# Patient Record
Sex: Female | Born: 1970
Health system: Southern US, Community
[De-identification: ages and names within clinical notes are randomized; demographics above are authoritative.]

## PROBLEM LIST (undated history)

## (undated) DIAGNOSIS — M722 Plantar fascial fibromatosis: Secondary | ICD-10-CM

## (undated) HISTORY — DX: Plantar fascial fibromatosis: M72.2

## (undated) HISTORY — PX: OTHER SURGICAL HISTORY: SHX169

---

## 2015-06-26 LAB — CBC AND DIFFERENTIAL
HCT: 40 % (ref 36–46)
HEMOGLOBIN: 13.2 g/dL (ref 12.0–16.0)
Platelets: 364 10*3/uL (ref 150–399)
WBC: 8.1 10*3/mL

## 2015-06-26 LAB — HEPATIC FUNCTION PANEL
ALT: 26 U/L (ref 7–35)
AST: 18 U/L (ref 13–35)
Bilirubin, Total: 0.3 mg/dL

## 2015-06-26 LAB — LIPID PANEL
Cholesterol: 169 mg/dL (ref 0–200)
HDL: 40 mg/dL (ref 35–70)
LDL Cholesterol: 108 mg/dL
Triglycerides: 105 mg/dL (ref 40–160)

## 2015-06-26 LAB — TSH: TSH: 1.18 u[IU]/mL (ref 0.41–5.90)

## 2016-07-11 ENCOUNTER — Encounter: Payer: Self-pay | Admitting: Family Medicine

## 2016-07-11 ENCOUNTER — Ambulatory Visit (INDEPENDENT_AMBULATORY_CARE_PROVIDER_SITE_OTHER): Payer: Self-pay | Admitting: Family Medicine

## 2016-07-11 DIAGNOSIS — M79606 Pain in leg, unspecified: Secondary | ICD-10-CM

## 2016-07-11 NOTE — Progress Notes (Signed)
Patient presents today with symptoms of bilateral lower extremity pain. Her symptoms are mostly when she gets up from a seated or lying position. Patient states that the symptoms started about one month ago. Patient has been on a weight loss program taking phenteramine and metformin for the last 2 months. She denies any history of diabetes, hypertension, DVT, PE. She denies any headache, vision problems, chest pain or shortness of breath. She denies any swelling of the lower extremities. The pain is not consistently in one specific spot. She states that the symptoms start in the proximal hamstring area and are in different places in both lower extremities at different times throughout the day. At times she feels like she cannot get moving after she has been seated for a while. She denies any incontinence, foot drop, focal lower back pain. She denies any history of back pain that she has had in the past. She knows that she has a history of vitamin D deficiency. She does not take vitamin D supplements.  ROS: Negative except mentioned above. Vitals as per Epic.  GENERAL: NAD, morbidly obese  RESP: CTA B CARD: RRR MSK: No tenderness along the spine, patient describes some discomfort in bilateral upper hamstring area, different areas on the lower extremities were palpated and there was no pain elicited, negative Homans, no pitting edema in the lower extremities, full range of motion, negative straight leg raise, NV intact NEURO: CN II-XII grossly intact   A/P: Bilateral lower extremity pain - unsure as to the etiology but could be related to lower back pathology, will do x-rays of LS-spine, also will do some basic lab work such as BMP, TSH, A1c, vitamin D. I recommend that patient establish care with a primary care physician as soon as possible. May need evaluation by neurology if pathology from the lower back is not the reason for her symptoms. If she has worsening symptoms acutely she should go to the ER. Can  take Tylenol/Motrin when necessary for pain if needed.

## 2016-07-12 LAB — BASIC METABOLIC PANEL
BUN/Creatinine Ratio: 17 (ref 9–23)
BUN: 11 mg/dL (ref 6–24)
CALCIUM: 9.3 mg/dL (ref 8.7–10.2)
CHLORIDE: 99 mmol/L (ref 96–106)
CO2: 21 mmol/L (ref 18–29)
Creatinine, Ser: 0.64 mg/dL (ref 0.57–1.00)
GFR calc Af Amer: 126 mL/min/{1.73_m2} (ref >59)
GFR calc non Af Amer: 109 mL/min/{1.73_m2} (ref >59)
GLUCOSE: 103 mg/dL — AB (ref 65–99)
Potassium: 4.2 mmol/L (ref 3.5–5.2)
Sodium: 138 mmol/L (ref 134–144)

## 2016-07-12 LAB — TSH: TSH: 2.2 u[IU]/mL (ref 0.450–4.500)

## 2016-07-12 LAB — HEMOGLOBIN A1C
ESTIMATED AVERAGE GLUCOSE: 103 mg/dL
Hgb A1c MFr Bld: 5.2 % (ref 4.8–5.6)

## 2016-07-12 LAB — VITAMIN D 25 HYDROXY (VIT D DEFICIENCY, FRACTURES): VIT D 25 HYDROXY: 17 ng/mL — AB (ref 30.0–100.0)

## 2016-07-18 ENCOUNTER — Ambulatory Visit
Admission: RE | Admit: 2016-07-18 | Discharge: 2016-07-18 | Disposition: A | Discharge status: Home / Self Care | Payer: BLUE CROSS/BLUE SHIELD | Source: Ambulatory Visit | Attending: Family Medicine | Admitting: Family Medicine

## 2016-07-18 DIAGNOSIS — M79604 Pain in right leg: Secondary | ICD-10-CM | POA: Insufficient documentation

## 2016-07-18 DIAGNOSIS — M79605 Pain in left leg: Secondary | ICD-10-CM | POA: Insufficient documentation

## 2016-07-18 DIAGNOSIS — M79606 Pain in leg, unspecified: Secondary | ICD-10-CM

## 2016-07-18 DIAGNOSIS — M4186 Other forms of scoliosis, lumbar region: Secondary | ICD-10-CM | POA: Insufficient documentation

## 2016-07-18 DIAGNOSIS — M545 Low back pain: Secondary | ICD-10-CM | POA: Diagnosis not present

## 2016-07-18 DIAGNOSIS — M47896 Other spondylosis, lumbar region: Secondary | ICD-10-CM | POA: Diagnosis not present

## 2016-07-24 ENCOUNTER — Ambulatory Visit (HOSPITAL_COMMUNITY)
Admission: RE | Admit: 2016-07-24 | Discharge: 2016-07-24 | Disposition: A | Discharge status: Home / Self Care | Payer: BLUE CROSS/BLUE SHIELD | Source: Ambulatory Visit | Attending: Family Medicine | Admitting: Family Medicine

## 2016-07-24 ENCOUNTER — Other Ambulatory Visit: Payer: Self-pay

## 2016-07-24 ENCOUNTER — Other Ambulatory Visit: Payer: Self-pay | Admitting: Family Medicine

## 2016-07-24 DIAGNOSIS — E669 Obesity, unspecified: Secondary | ICD-10-CM | POA: Insufficient documentation

## 2016-07-24 DIAGNOSIS — M5126 Other intervertebral disc displacement, lumbar region: Secondary | ICD-10-CM | POA: Insufficient documentation

## 2016-07-24 DIAGNOSIS — M79605 Pain in left leg: Secondary | ICD-10-CM

## 2016-07-24 DIAGNOSIS — M48061 Spinal stenosis, lumbar region without neurogenic claudication: Secondary | ICD-10-CM | POA: Insufficient documentation

## 2016-07-24 NOTE — Progress Notes (Signed)
VASCULAR LAB PRELIMINARY  PRELIMINARY  PRELIMINARY  PRELIMINARY  Left lower extremity venous duplex completed.    Preliminary report:   Technically difficult due to body habitus.Left:  No evidence of DVT, superficial thrombosis, or Baker's cyst.All of the calf veins were limited for visibility   Westyn Keatley, RVS 07/24/2016, 4:03 PM

## 2016-08-11 ENCOUNTER — Encounter: Payer: Self-pay | Admitting: Internal Medicine

## 2016-08-11 ENCOUNTER — Ambulatory Visit (INDEPENDENT_AMBULATORY_CARE_PROVIDER_SITE_OTHER): Payer: BLUE CROSS/BLUE SHIELD | Admitting: Internal Medicine

## 2016-08-11 VITALS — BP 122/82 | HR 86 | Temp 97.6°F | Ht 64.0 in | Wt 292.0 lb

## 2016-08-11 DIAGNOSIS — M722 Plantar fascial fibromatosis: Secondary | ICD-10-CM | POA: Diagnosis not present

## 2016-08-11 DIAGNOSIS — M5126 Other intervertebral disc displacement, lumbar region: Secondary | ICD-10-CM | POA: Diagnosis not present

## 2016-08-11 DIAGNOSIS — Z6841 Body Mass Index (BMI) 40.0 and over, adult: Secondary | ICD-10-CM

## 2016-08-11 DIAGNOSIS — E559 Vitamin D deficiency, unspecified: Secondary | ICD-10-CM | POA: Diagnosis not present

## 2016-08-11 NOTE — Progress Notes (Signed)
Date:  08/11/2016   Name:  Katherine Booker   DOB:  Mar 25, 1971   MRN:  [id number]   Chief Complaint: Establish Care and Leg Pain (Pt stated seen Dr. Allena Katz mention back issue) Leg Pain   The incident occurred more than 1 week ago. There was no injury mechanism. The pain is present in the left thigh and right thigh. The quality of the pain is described as cramping and aching. The pain is mild. The pain has been constant since onset. Pertinent negatives include no numbness. The symptoms are aggravated by weight bearing and movement.  Back Pain  Associated symptoms include leg pain. Pertinent negatives include no chest pain, dysuria, fever, headaches, numbness or weakness.  She also had an [place] due to left calf pain - negative for DVT or Baker's cyst.  MRI Lumbar spine 07/24/16: IMPRESSION: 1. The most significant left-sided disease is at L4-5 where a left paramedian disc protrusion results in moderate left and mild right subarticular stenosis. 2. Mild subarticular and foraminal narrowing bilaterally at L3-4 is worse on the left. 3. Mild disc bulging at L2-3 without significant stenosis.  Vitamin D def - need to begin supplement.  Last level 17.0.  Review of Systems  Constitutional: Negative for chills, fatigue, fever and unexpected weight change (has lost about 30 lbs over past 3 months on weight loss program with metformin and phentermine).  Respiratory: Negative for cough, chest tightness, shortness of breath and wheezing.   Cardiovascular: Negative for chest pain, palpitations and leg swelling.  Gastrointestinal: Negative for diarrhea.  Genitourinary: Negative for difficulty urinating, dysuria, hematuria and menstrual problem.  Musculoskeletal: Positive for back pain and gait problem. Negative for joint swelling and myalgias.  Neurological: Negative for dizziness, tremors, weakness, numbness and headaches.  Hematological: Negative for adenopathy.    There are no active problems to  display for this patient.   Prior to Admission medications   Medication Sig Start Date End Date Taking? Authorizing Provider  predniSONE (STERAPRED UNI-PAK 48 TAB) 10 MG (48) TBPK tablet USE AS DIRECTED PER PACKAGING. 12 DAY TAPER 08/02/16   Historical Provider, MD    No Known Allergies  History reviewed. No pertinent surgical history.  Social History  Substance Use Topics  . Smoking status: Never Smoker  . Smokeless tobacco: Never Used  . Alcohol use No     Medication list has been reviewed and updated.   Physical Exam  Constitutional: She is oriented to person, place, and time. She appears well-developed. No distress.  HENT:  Head: Normocephalic and atraumatic.  Neck: Normal range of motion.  Cardiovascular: Normal rate, regular rhythm and normal heart sounds.   Pulmonary/Chest: Effort normal and breath sounds normal. No respiratory distress.  Musculoskeletal: Normal range of motion.       Right knee: Normal.       Left knee: Normal.       Lumbar back: She exhibits no tenderness, no edema and no spasm.       Left lower leg: She exhibits tenderness. She exhibits no swelling, no edema and no deformity.  Neurological: She is alert and oriented to person, place, and time.  Skin: Skin is warm and dry. No rash noted.  Psychiatric: She has a normal mood and affect. Her behavior is normal. Thought content normal.  Nursing note and vitals reviewed.   BP 122/82   Pulse 86   Temp 97.6 F (36.4 C)   Ht 5\' 4"  (1.626 m)   Wt 292 lb (132.5  kg)   LMP 06/25/2016   SpO2 98%   BMI 50.12 kg/m   Assessment and Plan: 1. HNP (herniated nucleus pulposus), lumbar Seeing Ortho tomorrow for definitive therapy  Did not respond to Nsaids, heat, stretching or prednisone  2. Plantar fasciitis improved  3. HM  Recommend she follow up for CPX, Pap and pelvic and mammogram Begin vitamin D supplement daily  Bari EdwardLaura Ticara Waner, MD [facility]  08/11/2016

## 2016-08-11 NOTE — Patient Instructions (Signed)

## 2016-08-12 DIAGNOSIS — M5416 Radiculopathy, lumbar region: Secondary | ICD-10-CM | POA: Diagnosis not present

## 2016-08-15 DIAGNOSIS — M5416 Radiculopathy, lumbar region: Secondary | ICD-10-CM | POA: Diagnosis not present

## 2016-09-09 DIAGNOSIS — M5416 Radiculopathy, lumbar region: Secondary | ICD-10-CM | POA: Diagnosis not present

## 2016-09-15 DIAGNOSIS — M5416 Radiculopathy, lumbar region: Secondary | ICD-10-CM | POA: Diagnosis not present

## 2016-09-16 ENCOUNTER — Ambulatory Visit: Payer: BLUE CROSS/BLUE SHIELD | Admitting: Physical Therapy

## 2016-09-18 ENCOUNTER — Encounter: Payer: BLUE CROSS/BLUE SHIELD | Admitting: Physical Therapy

## 2016-09-22 ENCOUNTER — Ambulatory Visit: Payer: BLUE CROSS/BLUE SHIELD | Attending: Orthopedic Surgery | Admitting: Physical Therapy

## 2016-09-22 ENCOUNTER — Encounter: Payer: Self-pay | Admitting: Physical Therapy

## 2016-09-22 DIAGNOSIS — R262 Difficulty in walking, not elsewhere classified: Secondary | ICD-10-CM | POA: Diagnosis not present

## 2016-09-22 DIAGNOSIS — M6281 Muscle weakness (generalized): Secondary | ICD-10-CM | POA: Insufficient documentation

## 2016-09-22 DIAGNOSIS — M79662 Pain in left lower leg: Secondary | ICD-10-CM | POA: Diagnosis not present

## 2016-09-22 DIAGNOSIS — M5442 Lumbago with sciatica, left side: Secondary | ICD-10-CM

## 2016-09-22 NOTE — Therapy (Signed)
[facility] [address] [place], [address] Phone: [phone]   Fax:  [phone]  Physical Therapy Evaluation  Patient Details  Name: Katherine Booker MRN: [id number] Date of Birth: 07/17/1971 Referring Provider: Loistine Simas  Encounter Date: 09/22/2016      PT End of Session - 09/22/16 0823    Visit Number 1   Number of Visits 7   Date for PT Re-Evaluation 11/03/16   PT Start Time 0727   PT Stop Time 0805   PT Time Calculation (min) 38 min   Activity Tolerance Patient tolerated treatment well;No increased pain   Behavior During Therapy WFL for tasks assessed/performed      Past Medical History:  Diagnosis Date  . Plantar fasciitis     Past Surgical History:  Procedure Laterality Date  . none      There were no vitals filed for this visit.       Subjective Assessment - 09/22/16 0805    Subjective Pt presents to clinic with B calf pain since thanksgiving and low back pain since christmas. Pt declines ever having back pain before and states that she had plantar fasciitis prior to onset of pain and believed that she had just changed the way she was walking which caused her back pain. Pt states prior to onset of pain over thanksgiving she was carrying a heavy bookbag on a work trip where she walked long distances prior to a long flight. Pt states that as her pain worsened she had an MRI and was diagnosed with a "bulging disc pressing on the nerve root." She states that she had a doppler to rule out DVT and cortisone shots that have alleviated the pain in her R calf. She is limited in her ability to walk for any period of time which limits her ability to work. Pt states that pain is better in the morning and declines having issues sleeping. Pt declines any abnormal bowel or bladder changes.   Pertinent History Low back pain radiating into LE since thanksgiving. 2 cortisone injections that alleviated pain in R  LE.   Limitations Standing;Walking   How long can you stand comfortably? Less than 10 minutes   How long can you walk comfortably? Less than 10 minutes   Diagnostic tests MRI and Xray "bulging disc pressing on nerve root", Doppler ruled out DVT   Patient Stated Goals Be able to work without limitations or pain   Currently in Pain? Yes   Pain Score 1    Pain Location Calf   Pain Orientation Left   Pain Descriptors / Indicators Burning   Pain Type Neuropathic pain   Pain Onset More than a month ago   Pain Frequency Intermittent   Aggravating Factors  Walking, standing   Pain Relieving Factors Better in the morning   Effect of Pain on Daily Activities Limited at work due to pain. Unable to walk long distances.   Multiple Pain Sites Yes   Pain Score 0   Pain Location Back   Pain Orientation Lower   Pain Descriptors / Indicators Aching   Pain Type Acute pain   Pain Radiating Towards Calf   Pain Onset More than a month ago   Pain Frequency Intermittent   Aggravating Factors  Standing, walking   Pain Relieving Factors Better in the morning   Effect of Pain on Daily Activities Limits ability to walk which is important for job.  [facility] PT Assessment - 09/22/16 0001      Assessment   Medical Diagnosis Left Lumbar radiculopathy   Referring Provider Minchew   Prior Therapy Worked with athletic trainer at [employer]     Precautions   Precautions None   Precaution Comments None     Restrictions   Weight Bearing Restrictions No     Balance Screen   Has the patient fallen in the past 6 months No   Has the patient had a decrease in activity level because of a fear of falling?  Yes   Is the patient reluctant to leave their home because of a fear of falling?  No     Home Environment   Living Environment Private residence   Living Arrangements Alone   Type of Home House   Home Access Level entry   Home Layout One level   Home Equipment None     Prior Function   Level of  Independence Independent   Vocation Full time employment   Vocation Requirements Sitting for long periods of time, walking, traveling   Leisure Work, watching TV     Cognition   Overall Cognitive Status Within Functional Limits for tasks assessed     Observation/Other Assessments   Observations Pt lumbar extension limited, CPA of L3-L5 hypomobile and painful, Unilateral PA of L3-L5 hypomobile bilaterally and painful R>L. Pt gait presents with wide BOS and hyperextension of knees. Pt increased thoracic kyphosis and forward head. DTR WNL and clonus negative. Pt responded well to direction preference exercises     Sensation   Additional Comments DTR WNL B     Posture/Postural Control   Posture Comments Sitting increased thoracic kyphosis and forward head, standing with wide base of support and hyperextended knees     ROM / Strength   AROM / PROM / Strength AROM     AROM   Overall AROM Comments Decreased lumbar extension, improved calf symptoms with repeated extensions.     Palpation   Palpation comment Point tender over low back L3-L5. Pain with palpation of midbelly of L gastroc     Ambulation/Gait   Gait Comments Wide base of support, hyperextended knees, increase thoracic kyphosis     Functional Gait  Assessment   Gait assessed  --     Objective: Standing extensions x10. Pt stated that her calf pain decr. With performance of this, no change in back pain.  Prone CPA to L3-L5 which pt stated was painful at first yet eased throughout treatment. Pt stated that her pain decreased to a 0 with treatment. Progressed to prone on elbows with CPA L3-L5 grade 3 3x30" each level. Pt was educated about directional preference and prone specifically prone on elbows as a position of pain relief. She was educated about being in this position opposed to sitting whenever possible mostly at night while watching TV.  Standing elbows on wall extension x10. Verbal and tactile cuing to not go into  flexion when returning from extension but to just return to neutral. Pt stated that this exercise felt "good" on her low back. Education given about performing this exercise several times a day especially when at work to break up long hours of sitting.  Education provided about sitting posture at work.                       PT Education - 09/22/16 9562    Education provided Yes   Education Details Course of PT, Prone  on elbows/extension as a position of pain relief, centralization of pain   Person(s) Educated Patient   Methods Explanation;Demonstration;Tactile cues;Verbal cues   Comprehension Verbalized understanding;Returned demonstration             PT Long Term Goals - 09/22/16 0854      PT LONG TERM GOAL #1   Title Pt modified oswestery low back pain disability questionaire score will decrease 8% to demonstrate a significant improvement of in function associated with LBP   Baseline 50%   Time 6   Period Weeks   Status New     PT LONG TERM GOAL #2   Title Pt pain at rest on NPRS at worst will be a 3/10 to demonstrate an improvement of pain and overall improvement in pain and quality of life   Baseline 8/10   Time 6   Period Weeks   Status New     PT LONG TERM GOAL #3   Title Pt will be i in HEP so pt can exercise at home to further facilitate and promote healing   Baseline not i   Time 6   Period Weeks   Status New     PT LONG TERM GOAL #4   Title Pt will be able to walk all necessary times at work without having to stop to lean on an object for rest so pt work performance can return to pre injury   Baseline Has to stop every 10 minutes to rest   Time 6   Period Weeks   Status New               Plan - 09/22/16 [id number]    Clinical Impression Statement Pt is a pleasant 46 year old female who presents to the clinic with a 3 month history of B calf pain and 2 month history of LBP. Pt has received 2 rounds of cortisone injections that alleviated  all symptoms in R LE. Pt is only able to walk for less than 10 minutes without having to lean on something to support her self. Pt DTR WNL and Clonus test negative. Pt gait is with wide BOS and knee hyper extension with increased thoracic kyphosis. Pt responded well to direction preferance exercises and had a decrease in calf pain. Continue direction preference exercises and STM/joint mobs to treat pain and progress to strengthening and ROM. Pt lacked lumbar extension that increased throughout treatment and exercise.   Rehab Potential Good   Clinical Impairments Affecting Rehab Potential Positive: Age, motivation            Negative: Medical comorbidities, work requirements   PT Frequency 1x / week   PT Duration 6 weeks   PT Treatment/Interventions ADLs/Self Care Home Management;Gait training;Stair training;Functional mobility training;Therapeutic activities;Therapeutic exercise;Balance training;Manual techniques;Passive range of motion;Dry needling;Energy conservation   PT Next Visit Plan Continue joint mobs and direction preference   PT Home Exercise Plan Prone on elbow as opposed to sitting while watching TV, Standing extension and extension on wall    Consulted and Agree with Plan of Care Patient      Patient will benefit from skilled therapeutic intervention in order to improve the following deficits and impairments:  Abnormal gait, Decreased activity tolerance, Decreased endurance, Decreased range of motion, Decreased strength, Difficulty walking, Hypomobility, Impaired sensation, Postural dysfunction, Pain, Impaired flexibility, Increased muscle spasms  Visit Diagnosis: Acute midline low back pain with left-sided sciatica - Plan: PT plan of care cert/re-cert  Pain of left calf - Plan:  PT plan of care cert/re-cert  Difficulty in walking, not elsewhere classified - Plan: PT plan of care cert/re-cert  Muscle weakness (generalized) - Plan: PT plan of care cert/re-cert     Problem  List Patient Active Problem List   Diagnosis Date Noted  . Vitamin D deficiency 08/11/2016  . Plantar fasciitis 08/11/2016  . HNP (herniated nucleus pulposus), lumbar 08/11/2016    Fisher,Benjamin PT DPT 09/22/2016, 2:11 PM  [facility] [address] [place], [address] Phone: [phone]   Fax:  [phone]  Name: Katherine Booker MRN: [id number] Date of Birth: 1970/09/25

## 2016-09-24 ENCOUNTER — Encounter: Payer: BLUE CROSS/BLUE SHIELD | Admitting: Physical Therapy

## 2016-09-30 ENCOUNTER — Ambulatory Visit: Payer: BLUE CROSS/BLUE SHIELD | Admitting: Physical Therapy

## 2016-09-30 DIAGNOSIS — M5442 Lumbago with sciatica, left side: Secondary | ICD-10-CM | POA: Diagnosis not present

## 2016-09-30 DIAGNOSIS — M79662 Pain in left lower leg: Secondary | ICD-10-CM | POA: Diagnosis not present

## 2016-09-30 DIAGNOSIS — M6281 Muscle weakness (generalized): Secondary | ICD-10-CM

## 2016-09-30 DIAGNOSIS — R262 Difficulty in walking, not elsewhere classified: Secondary | ICD-10-CM | POA: Diagnosis not present

## 2016-09-30 NOTE — Therapy (Signed)
[facility] [address] [place], [address] Phone: [phone]   Fax:  [phone]  Physical Therapy Treatment  Patient Details  Name: Katherine Booker MRN: [id number] Date of Birth: May 10, 1971 Referring Provider: Loistine Simas  Encounter Date: 09/30/2016      PT End of Session - 09/30/16 0829    Visit Number 2   Number of Visits 7   Date for PT Re-Evaluation 11/03/16   PT Start Time 0728   PT Stop Time 0806   PT Time Calculation (min) 38 min   Activity Tolerance Patient tolerated treatment well;No increased pain   Behavior During Therapy WFL for tasks assessed/performed      Past Medical History:  Diagnosis Date  . Plantar fasciitis     Past Surgical History:  Procedure Laterality Date  . none      There were no vitals filed for this visit.      Subjective Assessment - 09/30/16 0727    Subjective Pt reports no change in her symptoms. She had some pain in her R calf last week but it was only for one day after extensive walking.    Pertinent History Low back pain radiating into LE since thanksgiving. 2 cortisone injections that alleviated pain in R LE.   Limitations Standing;Walking   How long can you stand comfortably? Less than 10 minutes   How long can you walk comfortably? Less than 10 minutes   Diagnostic tests MRI and Xray "bulging disc pressing on nerve root", Doppler ruled out DVT   Patient Stated Goals Be able to work without limitations or pain   Currently in Pain? No/denies   Pain Score 0-No pain   Pain Onset More than a month ago   Pain Onset More than a month ago                  Objective:  CPAs, L UPAs grade III 3x1 min L1-L5.  Trigger point dry needling performed on L4 multifidus, 3 insertions, (no charge)  Following manual therapy and dry needling pt reported decr. Pain with upright posture.  Standing RTB scapular retraction, low row performed isometrically 3x5  with 5 sec. Holds to facilitate load tolerance.  Pt reported decr. Pain following session.              PT Education - 09/30/16 0829    Education provided Yes   Education Details HEP, isometrics   Person(s) Educated Patient   Methods Explanation   Comprehension Verbalized understanding             PT Long Term Goals - 09/22/16 0854      PT LONG TERM GOAL #1   Title Pt modified oswestery low back pain disability questionaire score will decrease 8% to demonstrate a significant improvement of in function associated with LBP   Baseline 50%   Time 6   Period Weeks   Status New     PT LONG TERM GOAL #2   Title Pt pain at rest on NPRS at worst will be a 3/10 to demonstrate an improvement of pain and overall improvement in pain and quality of life   Baseline 8/10   Time 6   Period Weeks   Status New     PT LONG TERM GOAL #3   Title Pt will be i in HEP so pt can exercise at home to further facilitate and promote healing   Baseline not i   Time 6  Period Weeks   Status New     PT LONG TERM GOAL #4   Title Pt will be able to walk all necessary times at work without having to stop to lean on an object for rest so pt work performance can return to pre injury   Baseline Has to stop every 10 minutes to rest   Time 6   Period Weeks   Status New               Plan - 09/30/16 0830    Clinical Impression Statement No change noted yet with PT, pt has no pain with sitting but consistent pain with standing. Focused on adding in isometric exercises today for low back to improve load tolerance.       Patient will benefit from skilled therapeutic intervention in order to improve the following deficits and impairments:     Visit Diagnosis: Pain of left calf  Muscle weakness (generalized)  Acute midline low back pain with left-sided sciatica     Problem List Patient Active Problem List   Diagnosis Date Noted  . Vitamin D deficiency 08/11/2016  . Plantar  fasciitis 08/11/2016  . HNP (herniated nucleus pulposus), lumbar 08/11/2016    Fisher,Benjamin PT DPT 09/30/2016, 10:29 AM  [facility] [address] [place], [place], [id number] Phone: [phone]   Fax:  [phone]  Name: Katherine Booker MRN: [id number] Date of Birth: 03-Jul-1971

## 2016-10-02 ENCOUNTER — Encounter: Payer: BLUE CROSS/BLUE SHIELD | Admitting: Physical Therapy

## 2016-10-09 ENCOUNTER — Ambulatory Visit: Payer: BLUE CROSS/BLUE SHIELD | Attending: Orthopedic Surgery | Admitting: Physical Therapy

## 2016-10-09 DIAGNOSIS — M5442 Lumbago with sciatica, left side: Secondary | ICD-10-CM | POA: Diagnosis not present

## 2016-10-09 DIAGNOSIS — M79662 Pain in left lower leg: Secondary | ICD-10-CM | POA: Insufficient documentation

## 2016-10-09 DIAGNOSIS — R262 Difficulty in walking, not elsewhere classified: Secondary | ICD-10-CM | POA: Insufficient documentation

## 2016-10-09 DIAGNOSIS — M6281 Muscle weakness (generalized): Secondary | ICD-10-CM | POA: Diagnosis not present

## 2016-10-09 NOTE — Therapy (Signed)
[facility] [address] [place], [place], [address] Phone: [phone]   Fax:  [phone]  Physical Therapy Treatment  Patient Details  Name: Katherine Booker MRN: [id number] Date of Birth: February 27, 1971 Referring Provider: Loistine SimasMinchew  Encounter Date: 10/09/2016      PT End of Session - 10/09/16 0801    Visit Number 3   Number of Visits 7   Date for PT Re-Evaluation 11/03/16   PT Start Time 0730   PT Stop Time 0800   PT Time Calculation (min) 30 min   Activity Tolerance Patient tolerated treatment well;No increased pain   Behavior During Therapy WFL for tasks assessed/performed      Past Medical History:  Diagnosis Date  . Plantar fasciitis     Past Surgical History:  Procedure Laterality Date  . none      There were no vitals filed for this visit.      Subjective Assessment - 10/09/16 0733    Subjective Pt reports overall she is having incr. pain even at rest at this time, centralizing over back.   Pertinent History Low back pain radiating into LE since thanksgiving. 2 cortisone injections that alleviated pain in R LE.   Limitations Standing;Walking   How long can you stand comfortably? Less than 10 minutes   How long can you walk comfortably? Less than 10 minutes   Diagnostic tests MRI and Xray "bulging disc pressing on nerve root", Doppler ruled out DVT   Patient Stated Goals Be able to work without limitations or pain   Currently in Pain? No/denies   Pain Onset More than a month ago   Multiple Pain Sites No   Pain Onset More than a month ago          Objective: STM performed extensively on lateral calf, vastus lateralis, HS, glutes, L lateral back musculature. Performed extensively, pt had multiple jump signs and trigger points which improved with STM.  Following this performed dry needling on vastus lateralis (no charge) with noted local twitch response.  Supine HS stretch performed with  straight leg and bent to straight, x5 min total.  Pt reported decr. "sensation" in LLE following treatment.                            PT Long Term Goals - 09/22/16 0854      PT LONG TERM GOAL #1   Title Pt modified oswestery low back pain disability questionaire score will decrease 8% to demonstrate a significant improvement of in function associated with LBP   Baseline 50%   Time 6   Period Weeks   Status New     PT LONG TERM GOAL #2   Title Pt pain at rest on NPRS at worst will be a 3/10 to demonstrate an improvement of pain and overall improvement in pain and quality of life   Baseline 8/10   Time 6   Period Weeks   Status New     PT LONG TERM GOAL #3   Title Pt will be i in HEP so pt can exercise at home to further facilitate and promote healing   Baseline not i   Time 6   Period Weeks   Status New     PT LONG TERM GOAL #4   Title Pt will be able to walk all necessary times at work without having to stop to lean on an object for  rest so pt work performance can return to pre injury   Baseline Has to stop every 10 minutes to rest   Time 6   Period Weeks   Status New               Plan - 10/09/16 0801    Clinical Impression Statement Pt noted significant improvement in LLE to back with soft tissue work, light stretching and dry needling. Inconsistent maintenance of improvement from session to session, however overall pt appears to be improving.   PT Frequency 1x / week   PT Duration 6 weeks   PT Treatment/Interventions ADLs/Self Care Home Management;Gait training;Stair training;Functional mobility training;Therapeutic activities;Therapeutic exercise;Balance training;Manual techniques;Passive range of motion;Dry needling;Energy conservation      Patient will benefit from skilled therapeutic intervention in order to improve the following deficits and impairments:     Visit Diagnosis: Pain of left calf  Acute midline low back pain with  left-sided sciatica     Problem List Patient Active Problem List   Diagnosis Date Noted  . Vitamin D deficiency 08/11/2016  . Plantar fasciitis 08/11/2016  . HNP (herniated nucleus pulposus), lumbar 08/11/2016    Shelvy Heckert PT DPT 10/09/2016, 8:04 AM  [place] [facility]INE [address] [place], [address] Phone: [phone]   Fax:  [phone]  Name: Katherine Booker MRN: [id number] Date of Birth: 1970-11-09

## 2016-10-15 DIAGNOSIS — M5416 Radiculopathy, lumbar region: Secondary | ICD-10-CM | POA: Diagnosis not present

## 2016-10-16 ENCOUNTER — Ambulatory Visit: Payer: BLUE CROSS/BLUE SHIELD | Admitting: Physical Therapy

## 2016-10-16 ENCOUNTER — Encounter: Payer: Self-pay | Admitting: Physical Therapy

## 2016-10-16 DIAGNOSIS — M79662 Pain in left lower leg: Secondary | ICD-10-CM

## 2016-10-16 DIAGNOSIS — M5442 Lumbago with sciatica, left side: Secondary | ICD-10-CM

## 2016-10-16 DIAGNOSIS — M6281 Muscle weakness (generalized): Secondary | ICD-10-CM | POA: Diagnosis not present

## 2016-10-16 DIAGNOSIS — R262 Difficulty in walking, not elsewhere classified: Secondary | ICD-10-CM | POA: Diagnosis not present

## 2016-10-16 NOTE — Therapy (Signed)
[facility] [facility] [address] [place], [place], [address] Phone: [phone]   Fax:  [phone]  Physical Therapy Treatment  Patient Details  Name: Katherine Booker MRN: [id number] Date of Birth: Dec 03, 1970 Referring Provider: Loistine SimasMinchew  Encounter Date: 10/16/2016      PT End of Session - 10/16/16 0759    Visit Number 4   Number of Visits 7   Date for PT Re-Evaluation 11/03/16   PT Start Time 0800   PT Stop Time 0837   PT Time Calculation (min) 37 min   Activity Tolerance Patient tolerated treatment well;No increased pain   Behavior During Therapy WFL for tasks assessed/performed      Past Medical History:  Diagnosis Date  . Plantar fasciitis     Past Surgical History:  Procedure Laterality Date  . none      There were no vitals filed for this visit.      Subjective Assessment - 10/16/16 0800    Subjective Pt reports her pain has been about the same since last session.  Pt reports her pain all depends on how long she has to walk or stand.  She saw a back specialist yesterday and decision was made to do another injection either next week or in May depending on scheduling.   Pertinent History Low back pain radiating into LE since thanksgiving. 2 cortisone injections that alleviated pain in R LE.   Limitations Standing;Walking   How long can you stand comfortably? Less than 10 minutes   How long can you walk comfortably? Less than 10 minutes   Diagnostic tests MRI and Xray "bulging disc pressing on nerve root", Doppler ruled out DVT   Patient Stated Goals Be able to work without limitations or pain   Currently in Pain? Yes   Pain Score 3    Pain Location Calf   Pain Orientation Left   Pain Descriptors / Indicators Aching;Constant   Pain Type Chronic pain   Pain Onset More than a month ago   Multiple Pain Sites Yes   Pain Score 4   Pain Location Hip   Pain Orientation Left   Pain Descriptors / Indicators  Aching;Constant   Pain Type Chronic pain   Pain Onset More than a month ago       TREATMENT    Manual Therapy:   STM L lateral calf, L glutes, L lateral back musculature as trigger points appreciated in these regions   CPAs grade III 3x1 min L1-5   Manual supine HS stretch x3 minutes      Therapeutic Exercise:   Standing runner's stretch 2x30 sec gastroc and 2x30 sec soleus (added to HEP)  Seated figure 4 stretch 3x45 seconds               PT Education - 10/16/16 0758    Education provided Yes   Education Details Exercise technique   Person(s) Educated Patient   Methods Explanation;Demonstration   Comprehension Verbalized understanding;Returned demonstration;Need further instruction             PT Long Term Goals - 09/22/16 0854      PT LONG TERM GOAL #1   Title Pt modified oswestery low back pain disability questionaire score will decrease 8% to demonstrate a significant improvement of in function associated with LBP   Baseline 50%   Time 6   Period Weeks   Status New     PT LONG TERM GOAL #2   Title  Pt pain at rest on NPRS at worst will be a 3/10 to demonstrate an improvement of pain and overall improvement in pain and quality of life   Baseline 8/10   Time 6   Period Weeks   Status New     PT LONG TERM GOAL #3   Title Pt will be i in HEP so pt can exercise at home to further facilitate and promote healing   Baseline not i   Time 6   Period Weeks   Status New     PT LONG TERM GOAL #4   Title Pt will be able to walk all necessary times at work without having to stop to lean on an object for rest so pt work performance can return to pre injury   Baseline Has to stop every 10 minutes to rest   Time 6   Period Weeks   Status New               Plan - 10/16/16 0830    Clinical Impression Statement Pt presents with trigger points and increased muscular tension in L calf, glutes, and lower back and tolerated STM to these regions.   This was supplemented with a calf stretch and figure 4 stretch.  She will benefit from continued skilled PT interventions for decreased pain and improved QOL.   PT Frequency 1x / week   PT Duration 6 weeks   PT Treatment/Interventions ADLs/Self Care Home Management;Gait training;Stair training;Functional mobility training;Therapeutic activities;Therapeutic exercise;Balance training;Manual techniques;Passive range of motion;Dry needling;Energy conservation      Patient will benefit from skilled therapeutic intervention in order to improve the following deficits and impairments:     Visit Diagnosis: Pain of left calf  Acute midline low back pain with left-sided sciatica     Problem List Patient Active Problem List   Diagnosis Date Noted  . Vitamin D deficiency 08/11/2016  . Plantar fasciitis 08/11/2016  . HNP (herniated nucleus pulposus), lumbar 08/11/2016    Encarnacion Chu PT, DPT 10/16/2016, 8:38 AM  [place] [facility] PHYSICAL AND SPORTS MEDICINE [address] [place], [address] Phone: [phone]   Fax:  [phone]  Name: Katherine Booker MRN: [id number] Date of Birth: October 23, 1970

## 2016-10-20 DIAGNOSIS — M5416 Radiculopathy, lumbar region: Secondary | ICD-10-CM | POA: Diagnosis not present

## 2016-10-21 ENCOUNTER — Ambulatory Visit: Payer: BLUE CROSS/BLUE SHIELD

## 2016-10-21 DIAGNOSIS — M79662 Pain in left lower leg: Secondary | ICD-10-CM

## 2016-10-21 DIAGNOSIS — M6281 Muscle weakness (generalized): Secondary | ICD-10-CM

## 2016-10-21 DIAGNOSIS — M5442 Lumbago with sciatica, left side: Secondary | ICD-10-CM | POA: Diagnosis not present

## 2016-10-21 DIAGNOSIS — R262 Difficulty in walking, not elsewhere classified: Secondary | ICD-10-CM

## 2016-10-21 NOTE — Therapy (Signed)
[facility] [address] [place], [address] Phone: [phone]   Fax:  [phone]  Physical Therapy Treatment  Patient Details  Name: Katherine Booker MRN: [id number] Date of Birth: 1971-03-07 Referring Provider: Loistine Simas  Encounter Date: 10/21/2016      PT End of Session - 10/21/16 0732    Visit Number 5   Number of Visits 7   Date for PT Re-Evaluation 11/03/16   PT Start Time 0732   PT Stop Time 0820   PT Time Calculation (min) 48 min   Activity Tolerance Patient tolerated treatment well;No increased pain   Behavior During Therapy WFL for tasks assessed/performed      Past Medical History:  Diagnosis Date  . Plantar fasciitis     Past Surgical History:  Procedure Laterality Date  . none      There were no vitals filed for this visit.      Subjective Assessment - 10/21/16 0733    Subjective Just got a steriod injection in her L low back. L calf still hurts. Mornings are usually better for her. Walking or standing up during the day makes it worse. Works all the time. Sits a lot at work (a couple of hours at a time). Does not really bother sitting. Can tell its there but not hurting. Bothers her when she stands up and walks.  3/10 L calf currently. 0/10 back pain (never really had a lot of back pain; has been going on since Thanksgiving). Still  trying to decide if her steroid injection helped her. The first injection helped. The second did not, not really sure about the third injection.  Symptoms feel better after manual therapy but standiing and walking causes her symptoms to return.   Pertinent History Low back pain radiating into LE since thanksgiving. 2 cortisone injections that alleviated pain in R LE.   Limitations Standing;Walking   How long can you stand comfortably? Less than 10 minutes   How long can you walk comfortably? Less than 10 minutes   Diagnostic tests MRI and Xray "bulging disc  pressing on nerve root", Doppler ruled out DVT   Patient Stated Goals Be able to work without limitations or pain   Currently in Pain? Yes   Pain Score 3    Pain Onset More than a month ago   Pain Onset More than a month ago            [facility] PT Assessment - 10/21/16 0826      Observation/Other Assessments   Observations Long sit test suggests anterior nutation of L innominate                             PT Education - 10/21/16 0758    Education provided Yes   Education Details ther-ex   Person(s) Educated Patient   Methods Explanation;Demonstration;Tactile cues;Verbal cues   Comprehension Verbalized understanding;Returned demonstration       Objectives  L LE still hurts. The manual therapy helps her feel better but the carry over is not really there per pt.    There-ex   Sitting with lumbar towel roll. No change in L gastroc symptoms  Long sit test movement suggests anterior nutation of L innominate  Seated glute max squeeze 10x5 seconds for 2 sets  Seated gentle hip adduction ball squeeze 10x5 seconds with glute max squeeze for 2 sets  Seated trunk flexion rolling a  physioball 6x5 seconds. L posterior thigh pulling sensation, eases with rest.   Seated L hip extension isometrics 4x5 seconds. Slight increase in L gastroc symptoms. Eases with rest  Seated R hip extension isometrics 5x5 seconds. Increased L gastroc symptoms.   Seated L hip extension isometrics with L foot slightly more forward (more comfortable) 10x5 seconds for 2 sets. Decreased symptoms while performing exercise. Symptoms return at rest.    Improved exercise technique, movement at target joints, use of target muscles after min to mod verbal, visual, tactile cues.      Manual Therapy:   STM L piriformis   No L gastroc symptoms in S/L  Symptoms returned in sitting   Slight decreased L gastroc symptoms with activation of L glute muscle. Symptoms returned with rest.  Gentle exercises performed today.        PT Long Term Goals - 09/22/16 0854      PT LONG TERM GOAL #1   Title Pt modified oswestery low back pain disability questionaire score will decrease 8% to demonstrate a significant improvement of in function associated with LBP   Baseline 50%   Time 6   Period Weeks   Status New     PT LONG TERM GOAL #2   Title Pt pain at rest on NPRS at worst will be a 3/10 to demonstrate an improvement of pain and overall improvement in pain and quality of life   Baseline 8/10   Time 6   Period Weeks   Status New     PT LONG TERM GOAL #3   Title Pt will be i in HEP so pt can exercise at home to further facilitate and promote healing   Baseline not i   Time 6   Period Weeks   Status New     PT LONG TERM GOAL #4   Title Pt will be able to walk all necessary times at work without having to stop to lean on an object for rest so pt work performance can return to pre injury   Baseline Has to stop every 10 minutes to rest   Time 6   Period Weeks   Status New               Plan - 10/21/16 0759    Clinical Impression Statement Slight decreased L gastroc symptoms with activation of L glute muscle. Symptoms returned with rest. Gentle exercises performed today.    Rehab Potential Good   PT Frequency 1x / week   PT Duration 6 weeks   PT Treatment/Interventions ADLs/Self Care Home Management;Gait training;Stair training;Functional mobility training;Therapeutic activities;Therapeutic exercise;Balance training;Manual techniques;Passive range of motion;Dry needling;Energy conservation   PT Next Visit Plan glute strengthening, manual therapy, continue joint mobs and direction preference   Consulted and Agree with Plan of Care Patient      Patient will benefit from skilled therapeutic intervention in order to improve the following deficits and impairments:  Abnormal gait, Decreased activity tolerance, Decreased endurance, Decreased range of motion,  Decreased strength, Difficulty walking, Hypomobility, Impaired sensation, Postural dysfunction, Pain, Impaired flexibility, Increased muscle spasms  Visit Diagnosis: Pain of left calf  Acute midline low back pain with left-sided sciatica  Muscle weakness (generalized)  Difficulty in walking, not elsewhere classified     Problem List Patient Active Problem List   Diagnosis Date Noted  . Vitamin D deficiency 08/11/2016  . Plantar fasciitis 08/11/2016  . HNP (herniated nucleus pulposus), lumbar 08/11/2016   Loralyn Freshwater PT, DPT  10/21/2016, 8:33 AM  [place] [facility] [address] [place], [place], [address] Phone: [phone]   Fax:  [phone]  Name: Katherine Booker MRN: [id number] Date of Birth: [birth year]

## 2016-10-23 ENCOUNTER — Ambulatory Visit: Payer: BLUE CROSS/BLUE SHIELD

## 2016-10-23 DIAGNOSIS — M79662 Pain in left lower leg: Secondary | ICD-10-CM

## 2016-10-23 DIAGNOSIS — M5442 Lumbago with sciatica, left side: Secondary | ICD-10-CM

## 2016-10-23 DIAGNOSIS — M6281 Muscle weakness (generalized): Secondary | ICD-10-CM

## 2016-10-23 DIAGNOSIS — R262 Difficulty in walking, not elsewhere classified: Secondary | ICD-10-CM

## 2016-10-23 NOTE — Patient Instructions (Addendum)
  On your back with knees bent,    Press both hands against the bed to feel contraction of your abdominal muscles.   Hold for 5 seconds  Repeat 10 times.   Perform 3 sets daily.     Pt was recommended to posteriorly tilt pelvis and use abdominal muscles in standing.

## 2016-10-23 NOTE — Therapy (Signed)
[facility] [facility] [address] [place], [place], [address] Phone: [phone]   Fax:  [phone]  Physical Therapy Treatment  Patient Details  Name: Katherine Booker MRN: [id number] Date of Birth: 01-08-1971 Referring Provider: Loistine SimasMinchew  Encounter Date: 10/23/2016      PT End of Session - 10/23/16 0732    Visit Number 6   Number of Visits 7   Date for PT Re-Evaluation 11/03/16   PT Start Time 0732   PT Stop Time 0825   PT Time Calculation (min) 53 min   Activity Tolerance Patient tolerated treatment well;No increased pain   Behavior During Therapy WFL for tasks assessed/performed      Past Medical History:  Diagnosis Date  . Plantar fasciitis     Past Surgical History:  Procedure Laterality Date  . none      There were no vitals filed for this visit.      Subjective Assessment - 10/23/16 0733    Subjective Low back is fine. The leg definitely hurts. Feels like a line in the back of her thigh and butt. Has not seen any change from the injection.  2/10 low back (5/10 at most for the past 7 days), 3/10 L LE currently (8/10 at most for the past 7 days usually at the end of the day and when standing).  Low back and L LE is about the same after last session.  Pt states that the manual therapy (to the back and the leg) and the dry needling feels the same afterwards (does not know if its better but pain location is different, less pain in calf, but more towards the thigh and back. Pain changes location and lasts for about 24 hours and symptoms return to normal; both the manual therapy and the dry needling had a similar effect. ) The current plan was to do PT at clinic 1x/week and [facility].     Pertinent History Low back pain radiating into LE since thanksgiving. 2 cortisone injections that alleviated pain in R LE.   Limitations Standing;Walking   How long can you  stand comfortably? Less than 10 minutes   How long can you walk comfortably? Less than 10 minutes   Diagnostic tests MRI and Xray "bulging disc pressing on nerve root", Doppler ruled out DVT   Patient Stated Goals Be able to work without limitations or pain   Currently in Pain? Yes   Pain Score 3    Pain Onset More than a month ago   Pain Onset More than a month ago                                 PT Education - 10/23/16 0806    Education provided Yes   Education Details ther-ex, HEP   Person(s) Educated Patient   Methods Explanation;Demonstration;Tactile cues;Verbal cues;Handout   Comprehension Verbalized understanding;Returned demonstration        Objectives  Manual therapy: muscle energy technique to promote posterior nutation of L innominate   There-ex    Supine L hip extension isometrics with L LE straight 10x3 with 5 second holds  Gait 100 ft. L LE symptoms (3/10)  Then 100 ft with glute max sqeeze. Decreased L gastroc pain, increased L posterior hip pain.   Supine bridge: with glute max squeeze 2x. Increased L LE symptoms  With glute max squeeze and hip adductor ball squeeze 2x. Increased symptoms  With glute max squeeze and hip abduction isometrics 1x. Increased symptoms.   With bilateral shoulder extension isometrics 1x. Symptoms not as bad  With abdominal contraction 1x. Symptoms not as bad  Supine bilateral shoulder extension isometrics in hooklying position 10x5 seconds for 2 sets  Reviewed and given as part of her HEP. Pt demonstrated and verbalized understanding.   Standing bilateral shoulder extension resisting red band 10x5 seconds, 6x5 seconds  Max cues for posterior pelvic tilt with max assist secondary to difficulty   Pt was recommended to posteriorly tilt pelvis and use abdominal muscles in standing.     Improved exercise technique, movement at target joints, use of target muscles after min to max verbal, visual,  tactile cues.    Difficulty with posterior pelvic tilting in standing needing max cues and assist. Decreased L gastroc pain with posterior pelvic tilting and increasing abdominal muscle use in standing.          PT Long Term Goals - 09/22/16 0854      PT LONG TERM GOAL #1   Title Pt modified oswestery low back pain disability questionaire score will decrease 8% to demonstrate a significant improvement of in function associated with LBP   Baseline 50%   Time 6   Period Weeks   Status New     PT LONG TERM GOAL #2   Title Pt pain at rest on NPRS at worst will be a 3/10 to demonstrate an improvement of pain and overall improvement in pain and quality of life   Baseline 8/10   Time 6   Period Weeks   Status New     PT LONG TERM GOAL #3   Title Pt will be i in HEP so pt can exercise at home to further facilitate and promote healing   Baseline not i   Time 6   Period Weeks   Status New     PT LONG TERM GOAL #4   Title Pt will be able to walk all necessary times at work without having to stop to lean on an object for rest so pt work performance can return to pre injury   Baseline Has to stop every 10 minutes to rest   Time 6   Period Weeks   Status New               Plan - 10/23/16 1132    Clinical Impression Statement Difficulty with posterior pelvic tilting in standing needing max cues and assist. Decreased L gastroc pain with posterior pelvic tilting and increasing abdominal muscle use in standing.    Rehab Potential Good   PT Frequency 1x / week   PT Duration 6 weeks   PT Treatment/Interventions ADLs/Self Care Home Management;Gait training;Stair training;Functional mobility training;Therapeutic activities;Therapeutic exercise;Balance training;Manual techniques;Passive range of motion;Dry needling;Energy conservation   PT Next Visit Plan glute strengthening, manual therapy, continue joint mobs and direction preference   Consulted and Agree with Plan of Care Patient       Patient will benefit from skilled therapeutic intervention in order to improve the following deficits and impairments:  Abnormal gait, Decreased activity tolerance, Decreased endurance, Decreased range of motion, Decreased strength, Difficulty walking, Hypomobility, Impaired sensation, Postural dysfunction, Pain, Impaired flexibility, Increased muscle spasms  Visit Diagnosis: Pain of left calf  Acute midline low back pain with left-sided sciatica  Muscle weakness (generalized)  Difficulty in walking, not elsewhere classified  Problem List Patient Active Problem List   Diagnosis Date Noted  . Vitamin D deficiency 08/11/2016  . Plantar fasciitis 08/11/2016  . HNP (herniated nucleus pulposus), lumbar 08/11/2016   Loralyn Freshwater PT, DPT   10/23/2016, 11:52 AM  [place] [facility] PHYSICAL AND SPORTS MEDICINE [address], [place], [address] Phone: [phone]   Fax:  [phone]  Name: Katherine Booker MRN: [id number] Date of Birth: 1971/07/05

## 2016-10-28 ENCOUNTER — Ambulatory Visit: Payer: BLUE CROSS/BLUE SHIELD

## 2016-10-28 DIAGNOSIS — M79662 Pain in left lower leg: Secondary | ICD-10-CM

## 2016-10-28 DIAGNOSIS — M5442 Lumbago with sciatica, left side: Secondary | ICD-10-CM

## 2016-10-28 DIAGNOSIS — R262 Difficulty in walking, not elsewhere classified: Secondary | ICD-10-CM

## 2016-10-28 DIAGNOSIS — M6281 Muscle weakness (generalized): Secondary | ICD-10-CM

## 2016-10-28 NOTE — Therapy (Signed)
[facility] [address], [place], [address] Phone: [phone]   Fax:  [phone]  Physical Therapy Treatment  Patient Details  Name: Selinda Korzeniewski MRN: [id number] Date of Birth: 05/09/1971 Referring Provider: Loistine Simas  Encounter Date: 10/28/2016      PT End of Session - 10/28/16 0732    Visit Number 7   Number of Visits 7   Date for PT Re-Evaluation 11/03/16   PT Start Time 0732   PT Stop Time 0816   PT Time Calculation (min) 44 min   Activity Tolerance Patient tolerated treatment well;No increased pain   Behavior During Therapy WFL for tasks assessed/performed      Past Medical History:  Diagnosis Date  . Plantar fasciitis     Past Surgical History:  Procedure Laterality Date  . none      There were no vitals filed for this visit.      Subjective Assessment - 10/28/16 0733    Subjective Low back feels fine. The calf is like a 3-4/10 currently.  The HEP is going ok. Can't tell a big difference.  Has a hard time performing the pelvic tilts in standing.  Still had L calf symptoms when returning for follow up appointments after previous manual therapies.    Pertinent History Low back pain radiating into LE since thanksgiving. 2 cortisone injections that alleviated pain in R LE.   Limitations Standing;Walking   How long can you stand comfortably? Less than 10 minutes   How long can you walk comfortably? Less than 10 minutes   Diagnostic tests MRI and Xray "bulging disc pressing on nerve root", Doppler ruled out DVT   Patient Stated Goals Be able to work without limitations or pain   Currently in Pain? Yes   Pain Score 4    Pain Onset More than a month ago   Pain Onset More than a month ago                                 PT Education - 10/28/16 0738    Education provided Yes   Education Details ther-ex   [employer]) Educated Patient   Methods  Explanation;Demonstration;Tactile cues;Verbal cues   Comprehension Returned demonstration;Verbalized understanding        Objectives    There-ex   Seated shoulder extension isometrics with gentle trunk flexion 10x5 seconds for 3 sets Standing hip extension machine: L LE plate 40 for 69G2 Supine posterior pelvic tilting 10x3 with 5 second holds  Then with pelvic floor contraction 10x2 with 5 seconds  Then with hip fallouts 5x each LE   Difficulty with L hip fallout > R   Seated hip hinging 10x  Demonstrates increased lumbar movement instead of at hips  Improved exercise technique, movement at target joints, use of target muscles after mod verbal, visual, tactile cues.    Manual therapy Tried central UPA to L1-5. Good mobility palpated   Pt demonstrates difficulty with posterior pelvic tilting and pelvic control with hip fallouts L > R. Continue with core strengthening as appropriate.        PT Long Term Goals - 09/22/16 0854      PT LONG TERM GOAL #1   Title Pt modified oswestery low back pain disability questionaire score will decrease 8% to demonstrate a significant improvement of in function associated with LBP   Baseline 50%   Time 6  Period Weeks   Status New     PT LONG TERM GOAL #2   Title Pt pain at rest on NPRS at worst will be a 3/10 to demonstrate an improvement of pain and overall improvement in pain and quality of life   Baseline 8/10   Time 6   Period Weeks   Status New     PT LONG TERM GOAL #3   Title Pt will be i in HEP so pt can exercise at home to further facilitate and promote healing   Baseline not i   Time 6   Period Weeks   Status New     PT LONG TERM GOAL #4   Title Pt will be able to walk all necessary times at work without having to stop to lean on an object for rest so pt work performance can return to pre injury   Baseline Has to stop every 10 minutes to rest   Time 6   Period Weeks   Status New                Plan - 10/28/16 0730    Clinical Impression Statement Pt demonstrates difficulty with posterior pelvic tilting and pelvic control with hip fallouts L > R. Continue with core strengthening as appropriate.    Rehab Potential Good   PT Frequency 1x / week   PT Duration 6 weeks   PT Treatment/Interventions ADLs/Self Care Home Management;Gait training;Stair training;Functional mobility training;Therapeutic activities;Therapeutic exercise;Balance training;Manual techniques;Passive range of motion;Dry needling;Energy conservation   PT Next Visit Plan glute strengthening, manual therapy, continue joint mobs and direction preference   Consulted and Agree with Plan of Care Patient      Patient will benefit from skilled therapeutic intervention in order to improve the following deficits and impairments:  Abnormal gait, Decreased activity tolerance, Decreased endurance, Decreased range of motion, Decreased strength, Difficulty walking, Hypomobility, Impaired sensation, Postural dysfunction, Pain, Impaired flexibility, Increased muscle spasms  Visit Diagnosis: Pain of left calf  Acute midline low back pain with left-sided sciatica  Muscle weakness (generalized)  Difficulty in walking, not elsewhere classified     Problem List Patient Active Problem List   Diagnosis Date Noted  . Vitamin D deficiency 08/11/2016  . Plantar fasciitis 08/11/2016  . HNP (herniated nucleus pulposus), lumbar 08/11/2016    Loralyn FreshwaterMiguel Arran Fessel PT, DPT  10/28/2016, 7:27 PM  [facility] [address] [place], [place], [id number] Phone: [phone]   Fax:  [phone]  Name: Elige KoCayce Willcutt MRN: [id number] Date of Birth: [birth year]

## 2016-10-28 NOTE — Patient Instructions (Addendum)
  PELVIC TILT: Posterior    Tighten abdominals, flatten low back.  Activate pelvic floor muscles.  Hold for 5 seconds.  _10__ reps per set, __3_ sets per day   Copyright  VHI. All rights reserved.

## 2016-11-03 ENCOUNTER — Ambulatory Visit: Payer: BLUE CROSS/BLUE SHIELD | Attending: Orthopedic Surgery

## 2016-11-03 DIAGNOSIS — M5442 Lumbago with sciatica, left side: Secondary | ICD-10-CM | POA: Insufficient documentation

## 2016-11-03 DIAGNOSIS — M6281 Muscle weakness (generalized): Secondary | ICD-10-CM | POA: Insufficient documentation

## 2016-11-03 DIAGNOSIS — M79662 Pain in left lower leg: Secondary | ICD-10-CM | POA: Diagnosis not present

## 2016-11-03 DIAGNOSIS — R262 Difficulty in walking, not elsewhere classified: Secondary | ICD-10-CM | POA: Insufficient documentation

## 2016-11-03 NOTE — Therapy (Signed)
[facility] [address] [place], [address] Phone: [phone]   Fax:  [phone]  Physical Therapy Treatment  Patient Details  Name: Gwendy Boeder MRN: [id number] Date of Birth: [birth year] Referring Provider: Harvie Bridge, MD  Encounter Date: 11/03/2016      PT End of Session - 11/03/16 0733    Visit Number 8   Number of Visits 12   Date for PT Re-Evaluation 12/04/16   PT Start Time 0734   PT Stop Time 0819   PT Time Calculation (min) 45 min   Activity Tolerance Patient tolerated treatment well;No increased pain   Behavior During Therapy WFL for tasks assessed/performed      Past Medical History:  Diagnosis Date  . Plantar fasciitis     Past Surgical History:  Procedure Laterality Date  . none      There were no vitals filed for this visit.      Subjective Assessment - 11/03/16 0735    Subjective Low back is fine. The calf muscle is a 3/10 currently. 8-9/10 L calf pain at worst for the past 7 days.  Pt states she tries to work on her calf 3 times a day. Not sure if she can tell a change.    Pertinent History Low back pain radiating into LE since thanksgiving. 2 cortisone injections that alleviated pain in R LE.   Limitations Standing;Walking   How long can you stand comfortably? Less than 10 minutes   How long can you walk comfortably? Less than 10 minutes   Diagnostic tests MRI and Xray "bulging disc pressing on nerve root", Doppler ruled out DVT   Patient Stated Goals Be able to work without limitations or pain   Currently in Pain? Yes   Pain Score 3    Pain Onset More than a month ago   Pain Onset More than a month ago            [facility] PT Assessment - 11/03/16 0737      Assessment   Referring Provider Harvie Bridge, MD     Observation/Other Assessments   Modified [place] 42%     AROM   Lumbar Flexion [facility]   Lumbar Extension limited with reproduction of L gastroc  pain. Limited thoracic extension   Lumbar - Right Side [facility], no pain with over pressure   Lumbar - Left Side Bend limited with L gastroc pain   Lumbar - Right Rotation WFL no pain   Lumbar - Left Rotation WFL no pain                             PT Education - 11/03/16 0745    Education provided Yes   Education Details ther-ex, HEP   Person(s) Educated Patient   Methods Explanation;Demonstration;Tactile cues;Verbal cues;Handout   Comprehension Returned demonstration;Verbalized understanding        Objectives    There-ex   Standing lumbar flexion, extension, side bending (R and L), seated trunk rotation 1x each way  Reproduced symptoms with lumbar extension and L side bending movements  Seated bilateral shoulder extension isometrics 10x3 with 5 seconds to promote abdominal muscle use.  Supine posterior pelvic tilting 10x with 5 second holds   Then with pelvic floor contraction 10x5 seconds  Then with hip fallouts 10x3 each LE  Difficulty with R hip fallout > L today  Supine manually resisted L lower trunk rotation isometrics (neutral position) with PT manual resistance. 10x2 with 5 seconds  Supine manually resisted R lower trunk rotation isometrics (neutral position with PT manual resistance. 10x2 with 5 seconds  Supine manually resisted pallof press 10x5 seconds each side     Improved exercise technique, movement at target joints, use of target muscles after min to mod verbal, visual, tactile cues.     Reproduction of symptoms with lumbar extension and L side bending movements (movements that decreased L intervertebral foraminal space). Difficulty with lumbopelvic control and use of abdominal muscle with activities. Decreased L gastroc symptoms in standing today with with activation of abdominal muscles and posterior pelvic tilting. Pt still demonstrates core weakness, difficulty with lumbopelvic control, pain, and  difficulty performing functional tasks such as walking for long periods and would benefit from continued skilled physical therapy services to address the aforementioned deficits.          PT Long Term Goals - 11/03/16 0752      PT LONG TERM GOAL #1   Title Pt modified oswestery low back pain disability questionaire score will decrease 8% to demonstrate a significant improvement of in function associated with LBP   Baseline 50%; 42% (11/03/2016)   Time 4   Period Weeks   Status On-going     PT LONG TERM GOAL #2   Title Pt pain at rest on NPRS at worst will be a 3/10 to demonstrate an improvement of pain and overall improvement in pain and quality of life   Baseline 8/10; 8-9/10 L calf pain (11/03/2016)   Time 4   Period Weeks   Status On-going     PT LONG TERM GOAL #3   Title Pt will be i in HEP so pt can exercise at home to further facilitate and promote healing   Baseline not i   Time 4   Period Weeks   Status On-going     PT LONG TERM GOAL #4   Title Pt will be able to walk all necessary times at work without having to stop to lean on an object for rest so pt work performance can return to pre injury   Baseline Has to stop every 10 minutes to rest; 10 min of walking, then has to rest (11/03/2016)   Time 4   Period Weeks   Status On-going               Plan - 11/03/16 0731    Clinical Impression Statement Reproduction of symptoms with lumbar extension and L side bending movements (movements that decreased L intervertebral foraminal space). Difficulty with lumbopelvic control and use of abdominal muscle with activities. Decreased L gastroc symptoms in standing today with with activation of abdominal muscles and posterior pelvic tilting. Pt still demonstrates core weakness, difficulty with lumbopelvic control, pain, and difficulty performing functional tasks such as walking for long periods and would benefit from continued skilled physical therapy services to address the  aforementioned deficits.    Rehab Potential Good   PT Frequency 1x / week   PT Duration 4 weeks   PT Treatment/Interventions ADLs/Self Care Home Management;Gait training;Stair training;Functional mobility training;Therapeutic activities;Therapeutic exercise;Balance training;Manual techniques;Passive range of motion;Dry needling;Energy conservation;Aquatic Therapy;Iontophoresis /ml Dexamethasone;Electrical Stimulation;Traction;Ultrasound;Neuromuscular re-education;Patient/family education  traction if appropriate   PT Next Visit Plan glute strengthening, manual therapy, core strengthening, lumbopelvic control   Consulted and Agree with Plan of Care Patient  Patient will benefit from skilled therapeutic intervention in order to improve the following deficits and impairments:  Abnormal gait, Decreased activity tolerance, Decreased endurance, Decreased range of motion, Decreased strength, Difficulty walking, Hypomobility, Impaired sensation, Postural dysfunction, Pain, Impaired flexibility, Increased muscle spasms  Visit Diagnosis: Pain of left calf - Plan: PT plan of care cert/re-cert  Acute midline low back pain with left-sided sciatica - Plan: PT plan of care cert/re-cert  Muscle weakness (generalized) - Plan: PT plan of care cert/re-cert  Difficulty in walking, not elsewhere classified - Plan: PT plan of care cert/re-cert     Problem List Patient Active Problem List   Diagnosis Date Noted  . Vitamin D deficiency 08/11/2016  . Plantar fasciitis 08/11/2016  . HNP (herniated nucleus pulposus), lumbar 08/11/2016    Loralyn Freshwater PT, DPT   11/03/2016, 8:51 AM  [facility] PHYS[facility] AND SPORTS MEDICINE [address] [place], [address] Phone: [phone]   Fax:  [phone]  Name: Julane Crock MRN: [id number] Date of Birth: 08/28/1970

## 2016-11-03 NOTE — Patient Instructions (Signed)
  Reviewed and given seated bilateral shoulder extension isometrics 10x3 with 5 second holds daily  And supine hip fallouts (with abdominal and pelvic floor muscle use) 10x3 with 5 seconds 3x/day, 5 days a week. Handouts provided.  Pt demonstrated and verbalized understanding.

## 2016-11-10 ENCOUNTER — Ambulatory Visit: Payer: BLUE CROSS/BLUE SHIELD

## 2016-11-17 ENCOUNTER — Ambulatory Visit: Payer: BLUE CROSS/BLUE SHIELD

## 2016-11-17 DIAGNOSIS — M79662 Pain in left lower leg: Secondary | ICD-10-CM

## 2016-11-17 DIAGNOSIS — M5442 Lumbago with sciatica, left side: Secondary | ICD-10-CM | POA: Diagnosis not present

## 2016-11-17 DIAGNOSIS — R262 Difficulty in walking, not elsewhere classified: Secondary | ICD-10-CM | POA: Diagnosis not present

## 2016-11-17 DIAGNOSIS — M6281 Muscle weakness (generalized): Secondary | ICD-10-CM | POA: Diagnosis not present

## 2016-11-17 NOTE — Patient Instructions (Signed)
Gave seated L hip extension isometrics 10x3 with 5 second holds daily as part of her HEP. Handout provided. Pt demonstrated and verbalized understanding.

## 2016-11-17 NOTE — Therapy (Signed)
[place] [facility] [address], [place], [address] Phone: [phone]   Fax:  [phone]  Physical Therapy Treatment  Patient Details  Name: Katherine Booker MRN: [id number] Date of Birth: 10/10/1970 Referring Provider: Harvie Bridge, MD  Encounter Date: 11/17/2016      PT End of Session - 11/17/16 0732    Visit Number 9   Number of Visits 12   Date for PT Re-Evaluation 12/04/16   PT Start Time 0732   PT Stop Time 0824   PT Time Calculation (min) 52 min   Activity Tolerance Patient tolerated treatment well;No increased pain   Behavior During Therapy WFL for tasks assessed/performed      Past Medical History:  Diagnosis Date  . Plantar fasciitis     Past Surgical History:  Procedure Laterality Date  . none      There were no vitals filed for this visit.      Subjective Assessment - 11/17/16 0733    Subjective Last week was busy. Has not not done much exercises (due to busy schedule) was in a lot of pain Friday. Once she got to sleep she felt better. 3/10 currently (L calf sitting)   Pertinent History Low back pain radiating into LE since thanksgiving. 2 cortisone injections that alleviated pain in R LE.   Limitations Standing;Walking   How long can you stand comfortably? Less than 10 minutes   How long can you walk comfortably? Less than 10 minutes   Diagnostic tests MRI and Xray "bulging disc pressing on nerve root", Doppler ruled out DVT   Patient Stated Goals Be able to work without limitations or pain   Currently in Pain? Yes   Pain Score 3    Pain Onset More than a month ago   Pain Onset More than a month ago                                 PT Education - 11/17/16 0742    Education provided Yes   Education Details ther-ex, HEP   Person(s) Educated Patient   Methods Explanation;Demonstration;Tactile cues;Verbal cues   Comprehension Returned  demonstration;Verbalized understanding        Objectives    There-ex    Supine posterior pelvic tilting with hip fallouts 10x2 each L LE  Supine L hip extension isometrics in L LE in single knee to chest position 4x5 seconds For 3 sets   2/10 L calf pain with walking 100 ft afterwards  Seated L hip extension isometrics 10x5 seconds with L foot on 3 inch step for 3 sets  No L calf pain in sitting afterwards   Seated pallof press straight resisting red band 10x2 with 5 second holds  standing low rows red band 10x5 seconds, then 8x5 seconds  Then with L foot on 3 inch step 10x5 seconds   Forward step up onto 3 inch step with L LE 5x with R UE assist  R pelvic drop observed with step ups and down  SLS on L LE with R tip toe assist with light touch assist to promote L glute med strength 10x2 with 5 second holds       Improved exercise technique, movement at target joints, use of target muscles after min to mod verbal, visual, tactile cues.    Slight decrease initially in L gastroc symptoms in standing with posterior pelvic tilting and  glute max squeeze but symptoms gradually return with prolonged standing. Difficulty with pelvic control during step ups, therefore worked on L glute med strengthening as well. Continued working on abdominal strengthening and L glute max strengthening to help promote increase in L lumbar intervertebral space in standing.                  PT Long Term Goals - 11/03/16 0752      PT LONG TERM GOAL #1   Title Pt modified oswestery low back pain disability questionaire score will decrease 8% to demonstrate a significant improvement of in function associated with LBP   Baseline 50%; 42% (11/03/2016)   Time 4   Period Weeks   Status On-going     PT LONG TERM GOAL #2   Title Pt pain at rest on NPRS at worst will be a 3/10 to demonstrate an improvement of pain and overall improvement in pain and quality of life   Baseline 8/10;  8-9/10 L calf pain (11/03/2016)   Time 4   Period Weeks   Status On-going     PT LONG TERM GOAL #3   Title Pt will be i in HEP so pt can exercise at home to further facilitate and promote healing   Baseline not i   Time 4   Period Weeks   Status On-going     PT LONG TERM GOAL #4   Title Pt will be able to walk all necessary times at work without having to stop to lean on an object for rest so pt work performance can return to pre injury   Baseline Has to stop every 10 minutes to rest; 10 min of walking, then has to rest (11/03/2016)   Time 4   Period Weeks   Status On-going               Plan - 11/17/16 0732    Clinical Impression Statement Slight decrease initially in L gastroc symptoms in standing with posterior pelvic tilting and glute max squeeze but symptoms gradually return with prolonged standing. Difficulty with pelvic control during step ups, therefore worked on L glute med strengthening as well. Continued working on abdominal strengthening and L glute max strengthening to help promote increase in L lumbar intervertebral space in standing.    Rehab Potential Good   PT Frequency 1x / week   PT Duration 4 weeks   PT Treatment/Interventions ADLs/Self Care Home Management;Gait training;Stair training;Functional mobility training;Therapeutic activities;Therapeutic exercise;Balance training;Manual techniques;Passive range of motion;Dry needling;Energy conservation;Aquatic Therapy;Iontophoresis /ml Dexamethasone;Electrical Stimulation;Traction;Ultrasound;Neuromuscular re-education;Patient/family education  traction if appropriate   PT Next Visit Plan glute strengthening, manual therapy, core strengthening, lumbopelvic control   Consulted and Agree with Plan of Care Patient      Patient will benefit from skilled therapeutic intervention in order to improve the following deficits and impairments:  Abnormal gait, Decreased activity tolerance, Decreased endurance, Decreased range  of motion, Decreased strength, Difficulty walking, Hypomobility, Impaired sensation, Postural dysfunction, Pain, Impaired flexibility, Increased muscle spasms  Visit Diagnosis: Pain of left calf  Acute midline low back pain with left-sided sciatica  Muscle weakness (generalized)  Difficulty in walking, not elsewhere classified     Problem List Patient Active Problem List   Diagnosis Date Noted  . Vitamin D deficiency 08/11/2016  . Plantar fasciitis 08/11/2016  . HNP (herniated nucleus pulposus), lumbar 08/11/2016    Loralyn Freshwater PT, DPT   11/17/2016, 8:30 AM  [facility]TS [facility]IC[facility]  [address] [place], [address] Phone: [phone]   Fax:  [phone]  Name: Katherine Booker MRN: [id number] Date of Birth: Jun 09, 1971

## 2016-11-24 ENCOUNTER — Ambulatory Visit: Payer: BLUE CROSS/BLUE SHIELD

## 2016-11-24 DIAGNOSIS — M79662 Pain in left lower leg: Secondary | ICD-10-CM

## 2016-11-24 DIAGNOSIS — M6281 Muscle weakness (generalized): Secondary | ICD-10-CM | POA: Diagnosis not present

## 2016-11-24 DIAGNOSIS — M5442 Lumbago with sciatica, left side: Secondary | ICD-10-CM | POA: Diagnosis not present

## 2016-11-24 DIAGNOSIS — R262 Difficulty in walking, not elsewhere classified: Secondary | ICD-10-CM

## 2016-11-24 NOTE — Therapy (Signed)
[facility] PH[facility] MEDICINE [address] [place], [address] Phone: [phone]   Fax:  [phone]  Physical Therapy Treatment  Patient Details  Name: Katherine Booker MRN: [id number] Date of Birth: Dec 27, 1970 Referring Provider: Harvie Bridge, MD  Encounter Date: 11/24/2016      PT End of Session - 11/24/16 0733    Visit Number 10   Number of Visits 12   Date for PT Re-Evaluation 12/04/16   PT Start Time 0733   PT Stop Time 0816   PT Time Calculation (min) 43 min   Activity Tolerance Patient tolerated treatment well;No increased pain   Behavior During Therapy WFL for tasks assessed/performed      Past Medical History:  Diagnosis Date  . Plantar fasciitis     Past Surgical History:  Procedure Laterality Date  . none      There were no vitals filed for this visit.      Subjective Assessment - 11/24/16 0734    Subjective L calf is ok. Its the same or maybe a little better but feels it more behind her L thigh and posterior hip. Had a really bad week last week. As soon as she had to stand up and walk, it got really bad. Has been doing her exercises.   3/10 L LE pain currently. Can tell its there but not bad.    Pertinent History Low back pain radiating into LE since thanksgiving. 2 cortisone injections that alleviated pain in R LE.   Limitations Standing;Walking   How long can you stand comfortably? Less than 10 minutes   How long can you walk comfortably? Less than 10 minutes   Diagnostic tests MRI and Xray "bulging disc pressing on nerve root", Doppler ruled out DVT   Patient Stated Goals Be able to work without limitations or pain   Pain Score 3    Pain Onset More than a month ago   Pain Onset More than a month ago                                 PT Education - 11/24/16 0754    Education provided Yes   Education Details ther-ex, HEP   Person(s) Educated Patient   Methods  Explanation;Demonstration;Tactile cues;Verbal cues;Handout   Comprehension Returned demonstration;Verbalized understanding        Objectives  Standing posture: left anterior pelvic rotation   There-ex    Standing R side bending to stretch L side 10x5 seconds. Decreased L anterior pelvic rotation in the R side bend position. Slight increase in L LE symptoms. Feels more symptoms if she side bends to the L. Eases with rest  Standing chops to the R resisting green band 10x5 seconds for 2 sets  Standing low rows with L foot on 3 inch step 10x 5 second holds. Not as much L LE symptoms as previous exercises  standing forward weight shifting onto 3 inch step targeting L glute max 10x5 seconds  Seated R hip extension isometrics 10x5 seconds (L posterior pelvic nutation palpated)   Seated L hip extension isometrics 10x5 seconds. Decreased L LE symptoms.   Standing R shoulder extension resisting green band 10x5 seconds  Then with L foot on 3 inch step resisting green band 10x5 seconds   Then resisting red band 10x5 seconds. Decreased L LE symptoms   standing R trunk rotation 10x5 seconds  Wall walks with  hands 5x2 to promote core strengthening. No increase in L LE symptoms.     Improved exercise technique, movement at target joints, use of target muscles after min to mod verbal, visual, tactile cues.    Increased symptoms with decreased L lumbar intervertebral foraminal space. Slight decrease in standing symptoms with core muscle activation and decreasing L anterior pelvic rotation position. Decreased L posterior calf pain in sitting after session. Still felt L posterior thigh symptoms.           PT Long Term Goals - 11/03/16 0752      PT LONG TERM GOAL #1   Title Pt modified oswestery low back pain disability questionaire score will decrease 8% to demonstrate a significant improvement of in function associated with LBP   Baseline 50%; 42% (11/03/2016)   Time 4    Period Weeks   Status On-going     PT LONG TERM GOAL #2   Title Pt pain at rest on NPRS at worst will be a 3/10 to demonstrate an improvement of pain and overall improvement in pain and quality of life   Baseline 8/10; 8-9/10 L calf pain (11/03/2016)   Time 4   Period Weeks   Status On-going     PT LONG TERM GOAL #3   Title Pt will be i in HEP so pt can exercise at home to further facilitate and promote healing   Baseline not i   Time 4   Period Weeks   Status On-going     PT LONG TERM GOAL #4   Title Pt will be able to walk all necessary times at work without having to stop to lean on an object for rest so pt work performance can return to pre injury   Baseline Has to stop every 10 minutes to rest; 10 min of walking, then has to rest (11/03/2016)   Time 4   Period Weeks   Status On-going               Plan - 11/24/16 0754    Clinical Impression Statement Increased symptoms with decreased L lumbar intervertebral foraminal space. Slight decrease in standing symptoms with core muscle activation and decreasing L anterior pelvic rotation position. Decreased L posterior calf pain in sitting after session. Still felt L posterior thigh symptoms.    Rehab Potential Good   PT Frequency 1x / week   PT Duration 4 weeks   PT Treatment/Interventions ADLs/Self Care Home Management;Gait training;Stair training;Functional mobility training;Therapeutic activities;Therapeutic exercise;Balance training;Manual techniques;Passive range of motion;Dry needling;Energy conservation;Aquatic Therapy;Iontophoresis /ml Dexamethasone;Electrical Stimulation;Traction;Ultrasound;Neuromuscular re-education;Patient/family education  traction if appropriate   PT Next Visit Plan glute strengthening, manual therapy, core strengthening, lumbopelvic control   Consulted and Agree with Plan of Care Patient      Patient will benefit from skilled therapeutic intervention in order to improve the following deficits  and impairments:  Abnormal gait, Decreased activity tolerance, Decreased endurance, Decreased range of motion, Decreased strength, Difficulty walking, Hypomobility, Impaired sensation, Postural dysfunction, Pain, Impaired flexibility, Increased muscle spasms  Visit Diagnosis: Pain of left calf  Acute midline low back pain with left-sided sciatica  Muscle weakness (generalized)  Difficulty in walking, not elsewhere classified     Problem List Patient Active Problem List   Diagnosis Date Noted  . Vitamin D deficiency 08/11/2016  . Plantar fasciitis 08/11/2016  . HNP (herniated nucleus pulposus), lumbar 08/11/2016    Loralyn Freshwater PT, DPT   11/24/2016, 9:41 AM  [facility] PH[facility] SPORTS MEDICINE [address], [place], [address] Phone: [phone]   Fax:  [phone]  Name: Katherine Booker MRN: [id number] Date of Birth: [birth year]

## 2016-11-24 NOTE — Patient Instructions (Signed)
Strengthening: Resisted Extension    Left foot on top of a 3 inch step.  Hold red band wiht right hand, arm forward. Pull arm back, elbow straight. Hold for 5 seconds Repeat ___10_ times per set. Do  2-3__ sets per session. Do _1__ sessions per day.  http://orth.exer.us/832   Copyright  VHI. All rights reserved.

## 2016-12-30 DIAGNOSIS — M5126 Other intervertebral disc displacement, lumbar region: Secondary | ICD-10-CM | POA: Diagnosis not present

## 2016-12-30 DIAGNOSIS — M5416 Radiculopathy, lumbar region: Secondary | ICD-10-CM | POA: Diagnosis not present

## 2017-02-10 DIAGNOSIS — Z01818 Encounter for other preprocedural examination: Secondary | ICD-10-CM | POA: Diagnosis not present

## 2017-02-10 DIAGNOSIS — M5126 Other intervertebral disc displacement, lumbar region: Secondary | ICD-10-CM | POA: Diagnosis not present

## 2017-02-10 DIAGNOSIS — R030 Elevated blood-pressure reading, without diagnosis of hypertension: Secondary | ICD-10-CM | POA: Diagnosis not present

## 2017-02-10 DIAGNOSIS — M5416 Radiculopathy, lumbar region: Secondary | ICD-10-CM | POA: Diagnosis not present

## 2017-02-10 DIAGNOSIS — Z6841 Body Mass Index (BMI) 40.0 and over, adult: Secondary | ICD-10-CM | POA: Diagnosis not present

## 2017-03-05 DIAGNOSIS — R030 Elevated blood-pressure reading, without diagnosis of hypertension: Secondary | ICD-10-CM | POA: Diagnosis not present

## 2017-03-05 DIAGNOSIS — Z9889 Other specified postprocedural states: Secondary | ICD-10-CM | POA: Diagnosis not present

## 2017-03-05 DIAGNOSIS — M5416 Radiculopathy, lumbar region: Secondary | ICD-10-CM | POA: Diagnosis not present

## 2017-03-05 DIAGNOSIS — Z880 Allergy status to penicillin: Secondary | ICD-10-CM | POA: Diagnosis not present

## 2017-03-05 DIAGNOSIS — M5116 Intervertebral disc disorders with radiculopathy, lumbar region: Secondary | ICD-10-CM | POA: Diagnosis not present

## 2017-03-05 DIAGNOSIS — Z6841 Body Mass Index (BMI) 40.0 and over, adult: Secondary | ICD-10-CM | POA: Diagnosis not present

## 2017-03-05 DIAGNOSIS — M5126 Other intervertebral disc displacement, lumbar region: Secondary | ICD-10-CM | POA: Diagnosis not present

## 2017-03-06 DIAGNOSIS — R030 Elevated blood-pressure reading, without diagnosis of hypertension: Secondary | ICD-10-CM | POA: Diagnosis not present

## 2017-03-06 DIAGNOSIS — Z880 Allergy status to penicillin: Secondary | ICD-10-CM | POA: Diagnosis not present

## 2017-03-06 DIAGNOSIS — M5416 Radiculopathy, lumbar region: Secondary | ICD-10-CM | POA: Diagnosis not present

## 2017-03-06 DIAGNOSIS — Z9889 Other specified postprocedural states: Secondary | ICD-10-CM | POA: Diagnosis not present

## 2017-03-06 DIAGNOSIS — Z6841 Body Mass Index (BMI) 40.0 and over, adult: Secondary | ICD-10-CM | POA: Diagnosis not present

## 2017-03-06 DIAGNOSIS — M5116 Intervertebral disc disorders with radiculopathy, lumbar region: Secondary | ICD-10-CM | POA: Diagnosis not present

## 2017-03-23 ENCOUNTER — Ambulatory Visit: Payer: BLUE CROSS/BLUE SHIELD | Attending: Orthopedic Surgery | Admitting: Physical Therapy

## 2017-03-23 DIAGNOSIS — M5442 Lumbago with sciatica, left side: Secondary | ICD-10-CM | POA: Diagnosis not present

## 2017-03-23 DIAGNOSIS — Z9889 Other specified postprocedural states: Secondary | ICD-10-CM | POA: Diagnosis not present

## 2017-03-23 DIAGNOSIS — R262 Difficulty in walking, not elsewhere classified: Secondary | ICD-10-CM | POA: Insufficient documentation

## 2017-03-23 DIAGNOSIS — G8929 Other chronic pain: Secondary | ICD-10-CM

## 2017-03-23 NOTE — Patient Instructions (Signed)
Sidelying hip abductions   Sidelying clamshells   TA contractions in supine with knees flexed

## 2017-03-23 NOTE — Therapy (Signed)
[facility] [address] [place], [address] Phone: [phone]   Fax:  [phone]  Physical Therapy Evaluation  Patient Details  Name: Katherine Booker MRN: [id number] Date of Birth: 06/16/1971 Referring Provider: Dr. Loistine Simas  Encounter Date: 03/23/2017      PT End of Session - 03/23/17 0858    Visit Number 1   Number of Visits 11   Date for PT Re-Evaluation 05/11/17   PT Start Time 0802   PT Stop Time 0857   PT Time Calculation (min) 55 min   Activity Tolerance Patient tolerated treatment well   Behavior During Therapy [facility] for tasks assessed/performed      Past Medical History:  Diagnosis Date  . Plantar fasciitis     Past Surgical History:  Procedure Laterality Date  . none      There were no vitals filed for this visit.       Subjective Assessment - 03/23/17 0807    Subjective Patient reports she went in for micro-discectomy on August 2nd. She has a no lifting/twisting/bending restriction currently, will be seeing her surgeon on Wednesday. Patient reports the calf and leg pain she was having have not been present since the operation. She has been sleeping on her back, with pillow under the knees. She reports more discomfort than pain at this time around the incision site.    Limitations Sitting;Standing;Walking   How long can you sit comfortably? 30-60 minutes   How long can you stand comfortably? 15-20 minutes    How long can you walk comfortably? 20-30 minutes   Patient Stated Goals To be able to walk more without pain.    Currently in Pain? Yes   Pain Score 1    Pain Location Back   Pain Orientation Lower   Pain Descriptors / Indicators Discomfort   Pain Type Chronic pain;Surgical pain   Pain Onset More than a month ago   Pain Frequency Intermittent            [facility] PT Assessment - 03/23/17 1259      Assessment   Medical Diagnosis S/p discectomy   Referring Provider Dr.  Loistine Simas   Prior Therapy Saw PT prior to operation     Precautions   Precautions Back   Precaution Comments No lifting > 3 pounds, no twisting, turning, bending     Restrictions   Weight Bearing Restrictions No     Balance Screen   Has the patient fallen in the past 6 months No   Has the patient had a decrease in activity level because of a fear of falling?  Yes     Home Environment   Living Environment Private residence     Prior Function   Level of Independence Independent   Vocation Full time employment   Vocation Requirements Patient has prolonged sitting, standing, and walking depending on the day     Cognition   Overall Cognitive Status Within Functional Limits for tasks assessed     Sensation   Light Touch Appears Intact     Slump test negative bilaterally  Patient reports intermittent decreased sensation in L foot, reports this is reduced but still present relative to pre-operative state.  Gait assessment - patient noted to have Trendelenburg gait bilaterally, however not antalgic at this time  Reflexes - 1+ at R knee, not found in ankles or L knee.  SLR- WNL bilaterally   Supine bridging x 5 (able to complete through  appropriate range with no increase in pain)   Sidelying hip abductions x 10 bilaterally (appropriate mechanics while completing)  Sidelying clamshells x 10 bilaterally (appropriate mechanics bilaterally).  TA isometric contractions (cuing to draw belly button to her spine) for 5-10" holds x 10 repetitions   Mini squats at the treadmill x 10 with bilateral HHA (no increase in pain reported x 2 sets        Objective measurements completed on examination: See above findings.                  PT Education - 03/23/17 1251    Education provided Yes   Education Details Be extra cautious with precautions for now, provided HEP.    Person(s) Educated Patient   Methods Explanation;Demonstration;Handout   Comprehension Verbalized  understanding;Returned demonstration             PT Long Term Goals - 03/23/17 0859      PT LONG TERM GOAL #1   Title Patient will ambulate for at least 30 minutes with no increase in symptoms to demonstrate improved tolerance for work related activities.    Time 4   Period Weeks   Status New   Target Date 04/20/17     PT LONG TERM GOAL #2   Title Patient will be able to sit for at least 60 minutes with no increase in symptoms to complete work related activities.      PT LONG TERM GOAL #3   Title Patient will report worst pain in lumbar spine as no more than 2/10 to demonstrate improved tolerance for ADLs.    Time 4   Period Weeks   Status New   Target Date 04/20/17                Plan - 03/23/17 1254    Clinical Impression Statement Patient is a pleasant 46 y/o female that presents roughly 2.5 weeks s/p discectomy and laminotomy. She reports nearly instant relief of pain down her leg, and walking has been much more comfortable since the operation. She is limited currently to no bending, twisting, or laterally flexing. She has been fairly compliant with this, and reports no increase in symptoms (just discomfort around the incision site). She is able to tolerate all exercises provided today to initiate NM control and build up to core/trunkal strengthening as appropriate per protocol. Patient would benefit from skilled PT services to address strength and body mechanics to return to prior level of activity,    Clinical Presentation Stable   Clinical Decision Making Moderate   Rehab Potential Good   PT Frequency 2x / week   PT Duration 4 weeks   PT Treatment/Interventions Aquatic Therapy;Biofeedback;Cryotherapy;Electrical Stimulation;Moist Heat;Gait training;Stair training;Neuromuscular re-education;Dry needling;Taping;Patient/family education;Therapeutic activities;Therapeutic exercise;Balance training   PT Next Visit Plan Progress through HEP to build TA/multifidi/gluteal  strength and activation as tolerated andappropriate.   PT Home Exercise Plan Supine bridging, sidelying clamshells and hip abductions, TA sets/isometrics.    Consulted and Agree with Plan of Care Patient      Patient will benefit from skilled therapeutic intervention in order to improve the following deficits and impairments:  Abnormal gait, Decreased balance, Difficulty walking, Decreased endurance, Decreased activity tolerance, Pain, Improper body mechanics  Visit Diagnosis: Difficulty in walking, not elsewhere classified  History of back surgery  Chronic midline low back pain with left-sided sciatica     Problem List Patient Active Problem List   Diagnosis Date Noted  . Vitamin D deficiency 08/11/2016  .  Plantar fasciitis 08/11/2016  . HNP (herniated nucleus pulposus), lumbar 08/11/2016   Alva Garnet PT, DPT, CSCS    03/23/2017, 1:01 PM  [place] [facility] PHYSICAL AND SPORTS MEDICINE [address], [place], [address] Phone: [phone]   Fax:  [phone]  Name: Vail Basista MRN: [id number] Date of Birth: [birth year]

## 2017-03-25 ENCOUNTER — Ambulatory Visit: Payer: BLUE CROSS/BLUE SHIELD | Admitting: Physical Therapy

## 2017-03-30 ENCOUNTER — Encounter: Payer: Self-pay | Admitting: Physical Therapy

## 2017-03-30 ENCOUNTER — Ambulatory Visit: Payer: BLUE CROSS/BLUE SHIELD

## 2017-03-30 DIAGNOSIS — M5442 Lumbago with sciatica, left side: Secondary | ICD-10-CM | POA: Diagnosis not present

## 2017-03-30 DIAGNOSIS — Z9889 Other specified postprocedural states: Secondary | ICD-10-CM

## 2017-03-30 DIAGNOSIS — G8929 Other chronic pain: Secondary | ICD-10-CM | POA: Diagnosis not present

## 2017-03-30 DIAGNOSIS — R262 Difficulty in walking, not elsewhere classified: Secondary | ICD-10-CM | POA: Diagnosis not present

## 2017-03-30 NOTE — Therapy (Signed)
[place] [facility]TS MEDICINE [address], [place], [address] Phone: [phone]   Fax:  [phone]  Physical Therapy Treatment  Patient Details  Name: Katherine Booker MRN: [id number] Date of Birth: March 09, 1971 Referring Provider: Dr. Loistine Simas  Encounter Date: 03/30/2017      PT End of Session - 03/30/17 0755    Visit Number 2   Number of Visits 11   Date for PT Re-Evaluation 05/11/17   PT Start Time 0758   PT Stop Time 0843   PT Time Calculation (min) 45 min   Activity Tolerance Patient tolerated treatment well   Behavior During Therapy [facility] for tasks assessed/performed      Past Medical History:  Diagnosis Date  . Plantar fasciitis     Past Surgical History:  Procedure Laterality Date  . none      There were no vitals filed for this visit.      Subjective Assessment - 03/30/17 0755    Subjective Pt reports she is doing well on this date. She states that her back was tired over the weekend because she had to spend a lot of time on her feet. She complians of some lower back tightness this morning but denies pain. She saw her orthopedic surgeon last week who was very pleased with her progress. He advised her to gradually return to her normal routine with respect to bending, lifting (still nothing over 5#), and twisting. No specific questions or concerns at this time.    Limitations Sitting;Standing;Walking   How long can you sit comfortably? 30-60 minutes   How long can you stand comfortably? 15-20 minutes    How long can you walk comfortably? 20-30 minutes   Patient Stated Goals To be able to walk more without pain.    Currently in Pain? No/denies  Tighthness in low back, no pain   Pain Onset --              TREATMENT  Ther-ex Sidelying hip abductions 2 x 15 bilateral; Supien bridging 2 x 15; Sidelying clamshells 2 x 15 bilateral; TA sets/isometrics 5s hold x 10, verbal cues for correct  contraction; Hooklying ant/post pelvic tilts 5s hold x 10 each, heavy verbal and tactile cues for proper performance; Hoolying postior pelvic tilt with heel slide x 5 bilateral, cues to avoid anterior pelvic tilt; Hooklying isometric abdominal contraction with pball press into knees 5s hold x 15  Manual Therapy Prone laying with moist heat on lower back during history and education x 5 minutes STM to bilateral lumbar paraspinals x 8 minutes;                 PT Education - 03/30/17 0755    Education provided Yes   Education Details Exercise form/technique, HEP progression   Person(s) Educated Patient   Methods Explanation   Comprehension Verbalized understanding             PT Long Term Goals - 03/23/17 0859      PT LONG TERM GOAL #1   Title Patient will ambulate for at least 30 minutes with no increase in symptoms to demonstrate improved tolerance for work related activities.    Time 4   Period Weeks   Status New   Target Date 04/20/17     PT LONG TERM GOAL #2   Title Patient will be able to sit for at least 60 minutes with no increase in symptoms to complete work related activities.  PT LONG TERM GOAL #3   Title Patient will report worst pain in lumbar spine as no more than 2/10 to demonstrate improved tolerance for ADLs.    Time 4   Period Weeks   Status New   Target Date 04/20/17               Plan - 03/30/17 0755    Clinical Impression Statement Pt demonstrates poor pelvic motor control and really struggles to understand anterior/posterior pelvic tilts. With significant verbal and tactile cues she is able to perform correctly. This exercises was added to her HEP. Poor abdominal strength noted with leg lowering in hooklying position. Abduction and external rotation weakness with sidelying hip strength exercises. Pt denies pain throughout session. Pt encouraged to follow-up as scheduled. Will continue to progress strengthening exercises as  appropriate.   Rehab Potential Good   PT Frequency 2x / week   PT Duration 4 weeks   PT Treatment/Interventions Aquatic Therapy;Biofeedback;Cryotherapy;Electrical Stimulation;Moist Heat;Gait training;Stair training;Neuromuscular re-education;Dry needling;Taping;Patient/family education;Therapeutic activities;Therapeutic exercise;Balance training   PT Next Visit Plan Progress through HEP to build TA/multifidi/gluteal strength and activation as tolerated and appropriate.   PT Home Exercise Plan Supine bridging, sidelying clamshells and hip abductions, TA sets/isometrics, hooklying pelvic tilts;   Consulted and Agree with Plan of Care Patient      Patient will benefit from skilled therapeutic intervention in order to improve the following deficits and impairments:  Abnormal gait, Decreased balance, Difficulty walking, Decreased endurance, Decreased activity tolerance, Pain, Improper body mechanics  Visit Diagnosis: History of back surgery     Problem List Patient Active Problem List   Diagnosis Date Noted  . Vitamin D deficiency 08/11/2016  . Plantar fasciitis 08/11/2016  . HNP (herniated nucleus pulposus), lumbar 08/11/2016   Lynnea Maizes PT, DPT   Aceyn Kathol 03/30/2017, 1:16 PM  [facility] PHYSICAL AND SPORTS MEDICINE [address], [place], [address] Phone: [phone]   Fax:  [phone]  Name: Katherine Booker MRN: [id number] Date of Birth: [birth year]

## 2017-04-01 ENCOUNTER — Ambulatory Visit: Payer: BLUE CROSS/BLUE SHIELD | Admitting: Physical Therapy

## 2017-04-07 ENCOUNTER — Ambulatory Visit: Payer: BLUE CROSS/BLUE SHIELD | Attending: Orthopedic Surgery | Admitting: Physical Therapy

## 2017-04-07 DIAGNOSIS — R262 Difficulty in walking, not elsewhere classified: Secondary | ICD-10-CM | POA: Diagnosis not present

## 2017-04-07 DIAGNOSIS — G8929 Other chronic pain: Secondary | ICD-10-CM | POA: Diagnosis not present

## 2017-04-07 DIAGNOSIS — Z9889 Other specified postprocedural states: Secondary | ICD-10-CM

## 2017-04-07 DIAGNOSIS — M5442 Lumbago with sciatica, left side: Secondary | ICD-10-CM | POA: Insufficient documentation

## 2017-04-07 NOTE — Patient Instructions (Addendum)
Standing hip abductions x 12, x 10 with cuing for having her feet not turn out (use of UEs)  Sidestepping x 10 per side with yellow t-band with HHA   Step ups x 8 per side with bilateral HHA to 2 risers

## 2017-04-07 NOTE — Therapy (Signed)
[facility] [address] [place], [address] Phone: [phone]   Fax:  [phone]  Physical Therapy Treatment  Patient Details  Name: Katherine Booker MRN: [id number] Date of Birth: 1971/01/07 Referring Provider: Dr. Loistine Simas  Encounter Date: 04/07/2017      PT End of Session - 04/07/17 0920    Visit Number 3   Number of Visits 11   Date for PT Re-Evaluation 05/11/17   PT Start Time 0904   PT Stop Time 0944   PT Time Calculation (min) 40 min   Activity Tolerance Patient tolerated treatment well   Behavior During Therapy [facility] for tasks assessed/performed      Past Medical History:  Diagnosis Date  . Plantar fasciitis     Past Surgical History:  Procedure Laterality Date  . none      There were no vitals filed for this visit.      Subjective Assessment - 04/07/17 0909    Subjective Patient reports she continues to get stiffness in her low back, especially when she stands up for long periods (15-20 minutes). She is not having any pain go down the legs. She has been completing her HEP as indicated, but will be ramping up what she is having to do at work in the next 2-3 weeks.    Limitations Sitting;Standing;Walking   How long can you sit comfortably? 30-60 minutes   How long can you stand comfortably? 15-20 minutes    How long can you walk comfortably? 20-30 minutes   Patient Stated Goals To be able to walk more without pain.    Currently in Pain? No/denies        Standing hip abductions x 12, x 10 with cuing for having her feet not turn out (use of UEs)  Sidestepping x 10 per side with yellow t-band with HHA x 2 bouts (felt fatigued in her hips at this point)  Step ups x 8 per side with bilateral HHA to 2 risers (felt some R knee pain even with lateral step down, thus dc'd)   Educated and observed patient on stretching program including Supine piriformis stretch with towel x 45" per side with  cuing for proper targeting and intensity  Supine towel assisted 90-90 hamstring stretching x 45" per side   Discussed standing lumbar extensions as well as prone on elbows for stretching her lumbar spine when tight, she reports she does prone on elbows at home already.   Standing palloff press with cable machine x 5# for 8 repetitions with 3-5" holds (required cuing for moving COM anteriorly to increase core demand, reduce lumbar strian she was feeling)                          PT Education - 04/07/17 0924    Education provided Yes   Education Details Educated patient that feeling tightness/stiffness is indicative of fatigue/overdoing it with this rehab. Encouraged her to perform stretching routine when she begins to feel tightness, and not perform strengthening exercises on those dates.    Person(s) Educated Patient   Methods Explanation;Demonstration   Comprehension Verbalized understanding;Returned demonstration             PT Long Term Goals - 03/23/17 0859      PT LONG TERM GOAL #1   Title Patient will ambulate for at least 30 minutes with no increase in symptoms to demonstrate improved tolerance for work related activities.  Time 4   Period Weeks   Status New   Target Date 04/20/17     PT LONG TERM GOAL #2   Title Patient will be able to sit for at least 60 minutes with no increase in symptoms to complete work related activities.      PT LONG TERM GOAL #3   Title Patient will report worst pain in lumbar spine as no more than 2/10 to demonstrate improved tolerance for ADLs.    Time 4   Period Weeks   Status New   Target Date 04/20/17               Plan - 04/07/17 0920    Clinical Impression Statement Patient demonstrates significant weakness with basic bodyweight resistance activities in standing focusing on hip abductor strength. She also demonstrates poor core strength/control with palloff press as she initially only felt in her back  until cued to bring her COM more anterior. Educated patient that when she is feeling tired/tight in her lumbar spine area that this is indicative of overdoing it, and would benefit from focusing on stretching at those points.    Clinical Presentation Stable   Clinical Decision Making Moderate   Rehab Potential Good   PT Frequency 2x / week   PT Duration 4 weeks   PT Treatment/Interventions Aquatic Therapy;Biofeedback;Cryotherapy;Electrical Stimulation;Moist Heat;Gait training;Stair training;Neuromuscular re-education;Dry needling;Taping;Patient/family education;Therapeutic activities;Therapeutic exercise;Balance training   PT Next Visit Plan Progress through HEP to build TA/multifidi/gluteal strength and activation as tolerated and appropriate.   PT Home Exercise Plan Supine bridging, sidelying clamshells and hip abductions, TA sets/isometrics, hooklying pelvic tilts;   Consulted and Agree with Plan of Care Patient      Patient will benefit from skilled therapeutic intervention in order to improve the following deficits and impairments:  Abnormal gait, Decreased balance, Difficulty walking, Decreased endurance, Decreased activity tolerance, Pain, Improper body mechanics  Visit Diagnosis: History of back surgery  Difficulty in walking, not elsewhere classified  Chronic midline low back pain with left-sided sciatica     Problem List Patient Active Problem List   Diagnosis Date Noted  . Vitamin D deficiency 08/11/2016  . Plantar fasciitis 08/11/2016  . HNP (herniated nucleus pulposus), lumbar 08/11/2016   Alva GarnetPatrick McNamara PT, DPT, CSCS    04/07/2017, 1:20 PM  [facility] [address] [place], [place], [address] Phone: [phone]   Fax:  [phone]  Name: Katherine Booker MRN: [id number] Date of Birth: 1970-08-06

## 2017-04-13 ENCOUNTER — Ambulatory Visit: Payer: BLUE CROSS/BLUE SHIELD | Admitting: Physical Therapy

## 2017-04-13 DIAGNOSIS — G8929 Other chronic pain: Secondary | ICD-10-CM | POA: Diagnosis not present

## 2017-04-13 DIAGNOSIS — R262 Difficulty in walking, not elsewhere classified: Secondary | ICD-10-CM | POA: Diagnosis not present

## 2017-04-13 DIAGNOSIS — Z9889 Other specified postprocedural states: Secondary | ICD-10-CM

## 2017-04-13 DIAGNOSIS — M5442 Lumbago with sciatica, left side: Secondary | ICD-10-CM

## 2017-04-13 NOTE — Patient Instructions (Addendum)
TRX sit to stands from chair x 12 for 3 sets   Standing hip abduction on MATRIX with 10# x 2 sets   Palloff press with red t-band and 1 leg with PWB x 10 for 3-5"holds   Palloff press x 10 repetitions x 3" hold son blue foam with 5# x 2 sets  Modified side planks x8 repetitions for 2 sets bilaterally

## 2017-04-13 NOTE — Therapy (Signed)
[facility] [address] [place], [place], [address] Phone: [phone]   Fax:  [phone]  Physical Therapy Treatment  Patient Details  Name: Katherine Booker MRN: [id number] Date of Birth: 06-Sep-1970 Referring Provider: Dr. Loistine SimasMinchew  Encounter Date: 04/13/2017      PT End of Session - 04/13/17 0809    Visit Number 4   Number of Visits 11   Date for PT Re-Evaluation 05/11/17   PT Start Time 0804   PT Stop Time 0843   PT Time Calculation (min) 39 min   Activity Tolerance Patient tolerated treatment well   Behavior During Therapy [facility] for tasks assessed/performed      Past Medical History:  Diagnosis Date  . Plantar fasciitis     Past Surgical History:  Procedure Laterality Date  . none      There were no vitals filed for this visit.      Subjective Assessment - 04/13/17 0806    Subjective Patient reports she was quite fatigued and tight on Saturday as she was on her feet for job functions most of the day. She is able to walk 15-20 minutes now without having to plan for it. She still gets occasional numbness in L leg and R sided buttock pain.    Limitations Sitting;Standing;Walking   How long can you sit comfortably? 30-60 minutes   How long can you stand comfortably? 15-20 minutes    How long can you walk comfortably? 20-30 minutes   Patient Stated Goals To be able to walk more without pain.    Currently in Pain? No/denies      TRX sit to stands from chair x 12 for 3 sets   Standing hip abduction on MATRIX with 10# x 2 sets   Palloff press with red t-band and 1 leg with PWB x 10 for 3-5"holds   Palloff press x 10 repetitions x 3" hold son blue foam with 5# x 2 sets  Modified side planks x8 repetitions for 2 sets bilaterally (cuing for appropriate technique and set up)                           PT Education - 04/13/17 0809    Education provided Yes   Education Details  Continue to progress strengthening in clinic.   Person(s) Educated Patient   Methods Explanation   Comprehension Verbalized understanding             PT Long Term Goals - 03/23/17 0859      PT LONG TERM GOAL #1   Title Patient will ambulate for at least 30 minutes with no increase in symptoms to demonstrate improved tolerance for work related activities.    Time 4   Period Weeks   Status New   Target Date 04/20/17     PT LONG TERM GOAL #2   Title Patient will be able to sit for at least 60 minutes with no increase in symptoms to complete work related activities.      PT LONG TERM GOAL #3   Title Patient will report worst pain in lumbar spine as no more than 2/10 to demonstrate improved tolerance for ADLs.    Time 4   Period Weeks   Status New   Target Date 04/20/17               Plan - 04/13/17 0810    Clinical Impression Statement Patient continues  to do well with short distance ambulation, but continues to fatigue and get stiff with prolonged standing in place, likely indicative of continued weakness of major thigh and core musculature. She is progressing well with exercises to address these deficits, and is performing HEP as indicated.    Clinical Presentation Stable   Clinical Decision Making Moderate   Rehab Potential Good   PT Frequency 2x / week   PT Duration 4 weeks   PT Treatment/Interventions Aquatic Therapy;Biofeedback;Cryotherapy;Electrical Stimulation;Moist Heat;Gait training;Stair training;Neuromuscular re-education;Dry needling;Taping;Patient/family education;Therapeutic activities;Therapeutic exercise;Balance training   PT Next Visit Plan Progress through HEP to build TA/multifidi/gluteal strength and activation as tolerated and appropriate.   PT Home Exercise Plan Supine bridging, sidelying clamshells and hip abductions, TA sets/isometrics, hooklying pelvic tilts;   Consulted and Agree with Plan of Care Patient      Patient will benefit from  skilled therapeutic intervention in order to improve the following deficits and impairments:  Abnormal gait, Decreased balance, Difficulty walking, Decreased endurance, Decreased activity tolerance, Pain, Improper body mechanics  Visit Diagnosis: History of back surgery  Difficulty in walking, not elsewhere classified  Chronic midline low back pain with left-sided sciatica     Problem List Patient Active Problem List   Diagnosis Date Noted  . Vitamin D deficiency 08/11/2016  . Plantar fasciitis 08/11/2016  . HNP (herniated nucleus pulposus), lumbar 08/11/2016   Alva Garnet PT, DPT, CSCS    04/13/2017, 8:50 AM  [place] [facility] PHYSICAL AND SPORTS MEDICINE [address], [place], [address] Phone: [phone]   Fax:  [phone]  Name: Katherine Booker MRN: [id number] Date of Birth: 20-Mar-1971

## 2017-04-15 ENCOUNTER — Ambulatory Visit: Payer: BLUE CROSS/BLUE SHIELD | Admitting: Physical Therapy

## 2017-04-20 ENCOUNTER — Ambulatory Visit: Payer: BLUE CROSS/BLUE SHIELD | Admitting: Physical Therapy

## 2017-04-20 DIAGNOSIS — M5442 Lumbago with sciatica, left side: Secondary | ICD-10-CM | POA: Diagnosis not present

## 2017-04-20 DIAGNOSIS — R262 Difficulty in walking, not elsewhere classified: Secondary | ICD-10-CM | POA: Diagnosis not present

## 2017-04-20 DIAGNOSIS — G8929 Other chronic pain: Secondary | ICD-10-CM

## 2017-04-20 DIAGNOSIS — Z9889 Other specified postprocedural states: Secondary | ICD-10-CM

## 2017-04-20 NOTE — Therapy (Signed)
[place]TS [facility]IC[facility] [address] [place], [address] Phone: [phone]   Fax:  [phone]  Physical Therapy Treatment  Patient Details  Name: Katherine Booker MRN: [id number] Date of Birth: [birth year] Referring Provider: Dr. Loistine Simas  Encounter Date: 04/20/2017      PT End of Session - 04/20/17 0810    Visit Number 5   Number of Visits 11   Date for PT Re-Evaluation 05/11/17   PT Start Time 0805   PT Stop Time 0844   PT Time Calculation (min) 39 min   Activity Tolerance Patient tolerated treatment well   Behavior During Therapy [facility] for tasks assessed/performed      Past Medical History:  Diagnosis Date  . Plantar fasciitis     Past Surgical History:  Procedure Laterality Date  . none      There were no vitals filed for this visit.      Subjective Assessment - 04/20/17 0805    Subjective Patient reports she has had a cold, she didn't have to work over the weekend and hasn't been having pain but didn't do much.    Limitations Sitting;Standing;Walking   How long can you sit comfortably? 30-60 minutes   How long can you stand comfortably? 15-20 minutes    How long can you walk comfortably? 20-30 minutes   Patient Stated Goals To be able to walk more without pain.    Currently in Pain? No/denies      Holding dead bug position and cycling arms only x 4 per side before fatigue x 3 bouts  Supine bridging x 9 with 5" holds for 2 sets   Sit to stands from mat table without hands x 12 (mild weight shifting to her R) second set with 2, 3# x8 repetitions,   Standing rows with bilateral handle hold 15# x 9 repetitions x 2 sets   Standing hip abductions on OMEGA with 25# x 6, 10# x 7 repetitions                            PT Education - 04/20/17 0810    Education provided Yes   Education Details Return to clinic on Thursday, will progress with core and LE strengthening to reduce  fatigue symptoms.    Person(s) Educated Patient   Methods Explanation;Demonstration;Verbal cues   Comprehension Verbalized understanding;Returned demonstration;Verbal cues required             PT Long Term Goals - 03/23/17 0859      PT LONG TERM GOAL #1   Title Patient will ambulate for at least 30 minutes with no increase in symptoms to demonstrate improved tolerance for work related activities.    Time 4   Period Weeks   Status New   Target Date 04/20/17     PT LONG TERM GOAL #2   Title Patient will be able to sit for at least 60 minutes with no increase in symptoms to complete work related activities.      PT LONG TERM GOAL #3   Title Patient will report worst pain in lumbar spine as no more than 2/10 to demonstrate improved tolerance for ADLs.    Time 4   Period Weeks   Status New   Target Date 04/20/17               Plan - 04/20/17 0810    Clinical Impression Statement Patient continues to  progress with core strengthening progressions and LE strengthening and endurance work. She continues to report no pain, just fatigue which is appropriate at this time.    Clinical Presentation Stable   Clinical Decision Making Moderate   Rehab Potential Good   PT Frequency 2x / week   PT Duration 4 weeks   PT Treatment/Interventions Aquatic Therapy;Biofeedback;Cryotherapy;Electrical Stimulation;Moist Heat;Gait training;Stair training;Neuromuscular re-education;Dry needling;Taping;Patient/family education;Therapeutic activities;Therapeutic exercise;Balance training   PT Next Visit Plan Progress through HEP to build TA/multifidi/gluteal strength and activation as tolerated and appropriate.   PT Home Exercise Plan Supine bridging, sidelying clamshells and hip abductions, TA sets/isometrics, hooklying pelvic tilts;   Consulted and Agree with Plan of Care Patient      Patient will benefit from skilled therapeutic intervention in order to improve the following deficits and  impairments:  Abnormal gait, Decreased balance, Difficulty walking, Decreased endurance, Decreased activity tolerance, Pain, Improper body mechanics  Visit Diagnosis: History of back surgery  Difficulty in walking, not elsewhere classified  Chronic midline low back pain with left-sided sciatica     Problem List Patient Active Problem List   Diagnosis Date Noted  . Vitamin D deficiency 08/11/2016  . Plantar fasciitis 08/11/2016  . HNP (herniated nucleus pulposus), lumbar 08/11/2016   Katherine Booker PT, DPT, CSCS    04/20/2017, 11:33 AM  [facility] PHYSICAL AND SPORTS MEDICINE [address] [place], [address] Phone: [phone]   Fax:  [phone]  Name: Katherine Booker MRN: [id number] Date of Birth: [birth year]

## 2017-04-20 NOTE — Patient Instructions (Addendum)
Holding dead bug position and cycling arms only x 4 per side before fatigue x 3 bouts  Supine bridging x 9 with 5" holds for 2 sets   Sit to stands from mat table without hands x 12 (mild weight shifting to her R) second set with 2, 3# x8 repetitions,   Standing rows with bilateral handle hold 15# x 9 repetitions x 2 sets   Standing hip abductions on OMEGA with 25# x 6, 10# x 7 repetitions

## 2017-04-23 ENCOUNTER — Ambulatory Visit: Payer: BLUE CROSS/BLUE SHIELD | Admitting: Physical Therapy

## 2017-04-23 DIAGNOSIS — G8929 Other chronic pain: Secondary | ICD-10-CM | POA: Diagnosis not present

## 2017-04-23 DIAGNOSIS — R262 Difficulty in walking, not elsewhere classified: Secondary | ICD-10-CM | POA: Diagnosis not present

## 2017-04-23 DIAGNOSIS — M5442 Lumbago with sciatica, left side: Secondary | ICD-10-CM | POA: Diagnosis not present

## 2017-04-23 DIAGNOSIS — Z9889 Other specified postprocedural states: Secondary | ICD-10-CM | POA: Diagnosis not present

## 2017-04-23 NOTE — Therapy (Signed)
[facility] PH[facility]TS MED[facility] [address], [place], [address] Phone: [phone]   Fax:  [phone]  Physical Therapy Treatment  Patient Details  Name: Katherine Booker MRN: [id number] Date of Birth: Aug 19, 1970 Referring Provider: Dr. Loistine Simas  Encounter Date: 04/23/2017      PT End of Session - 04/23/17 0933    Visit Number 6   Number of Visits 11   Date for PT Re-Evaluation 05/11/17   PT Start Time 0906   PT Stop Time 0945   PT Time Calculation (min) 39 min   Activity Tolerance Patient tolerated treatment well   Behavior During Therapy [facility] for tasks assessed/performed      Past Medical History:  Diagnosis Date  . Plantar fasciitis     Past Surgical History:  Procedure Laterality Date  . none      There were no vitals filed for this visit.      Subjective Assessment - 04/23/17 0907    Subjective Patient reports she is able to perform her work functions (like prolonged standing) with mild to moderate tightness, but no pain which resolves throughout the day.    Limitations Sitting;Standing;Walking   How long can you sit comfortably? 30-60 minutes   How long can you stand comfortably? 15-20 minutes    How long can you walk comfortably? 20-30 minutes   Patient Stated Goals To be able to walk more without pain.    Currently in Pain? No/denies      Standing hip abductions with yellow t-band (modified to add slight extension due to pain) 2 sets x 15 repetitions   Standing hip extensions x 2 sets for 15 repetitions (no pain while completing)   Chops with red t-band x 15 per side for 2 sets --- cuing for appropriate technique (belly button and nose facing the same direction, maintain neutral spine with rotation coming from her hips.   Standing rotations purely in rotational plane with red t-band x 12 repetitions for 2 sets bilaterally with cuing to rotate through her posterior leg.   Mini squats to chair x 15 for  2 sets with min A from HHA (patient reported fatigue in her LEs at this point).                            PT Education - 04/23/17 1053    Education provided Yes   Education Details Will decrease frequency to 1x per week, provided HEP to be completed daily/every other day.    Person(s) Educated Patient   Methods Explanation;Demonstration;Handout;Verbal cues   Comprehension Verbalized understanding;Returned demonstration;Verbal cues required             PT Long Term Goals - 03/23/17 0859      PT LONG TERM GOAL #1   Title Patient will ambulate for at least 30 minutes with no increase in symptoms to demonstrate improved tolerance for work related activities.    Time 4   Period Weeks   Status New   Target Date 04/20/17     PT LONG TERM GOAL #2   Title Patient will be able to sit for at least 60 minutes with no increase in symptoms to complete work related activities.      PT LONG TERM GOAL #3   Title Patient will report worst pain in lumbar spine as no more than 2/10 to demonstrate improved tolerance for ADLs.    Time 4  Period Weeks   Status New   Target Date 04/20/17               Plan - 04/23/17 0933    Clinical Impression Statement Patient reports her back continues to be pain free, she does still get fatigue/tightness but it isn't lasting like it used to. She has been consistent with her HEP and is likely appropriate to reduce visits to 1x per week for progressions. She was able to complete rotation based core strengthening with no increase in pain today. Her LE strength and endurance are progressing nicely.    Clinical Presentation Stable   Clinical Decision Making Moderate   Rehab Potential Good   PT Frequency 2x / week   PT Duration 4 weeks   PT Treatment/Interventions Aquatic Therapy;Biofeedback;Cryotherapy;Electrical Stimulation;Moist Heat;Gait training;Stair training;Neuromuscular re-education;Dry needling;Taping;Patient/family  education;Therapeutic activities;Therapeutic exercise;Balance training   PT Next Visit Plan Progress through HEP to build TA/multifidi/gluteal strength and activation as tolerated and appropriate.   PT Home Exercise Plan Supine bridging, sidelying clamshells and hip abductions, TA sets/isometrics, hooklying pelvic tilts;   Consulted and Agree with Plan of Care Patient      Patient will benefit from skilled therapeutic intervention in order to improve the following deficits and impairments:  Abnormal gait, Decreased balance, Difficulty walking, Decreased endurance, Decreased activity tolerance, Pain, Improper body mechanics  Visit Diagnosis: History of back surgery  Difficulty in walking, not elsewhere classified  Chronic midline low back pain with left-sided sciatica     Problem List Patient Active Problem List   Diagnosis Date Noted  . Vitamin D deficiency 08/11/2016  . Plantar fasciitis 08/11/2016  . HNP (herniated nucleus pulposus), lumbar 08/11/2016   Alva Garnet PT, DPT, CSCS    04/23/2017, 10:54 AM  [facility] PHYSICAL AND SPORTS MEDICINE [address] [place], [address] Phone: [phone]   Fax:  [phone]  Name: Katherine Booker MRN: [id number] Date of Birth: 04/28/1971

## 2017-04-23 NOTE — Patient Instructions (Addendum)
Standing hip abductions to   Standing hip extensions   Chops with red t-band x 15 per side for 2 sets  Standing rotations purely in rotational plane   Mini squats to chair x 15 for 2 sets with min A from [facility]

## 2017-04-29 ENCOUNTER — Encounter: Payer: BLUE CROSS/BLUE SHIELD | Admitting: Physical Therapy

## 2017-04-30 ENCOUNTER — Ambulatory Visit: Payer: BLUE CROSS/BLUE SHIELD | Admitting: Physical Therapy

## 2017-04-30 DIAGNOSIS — M5442 Lumbago with sciatica, left side: Secondary | ICD-10-CM

## 2017-04-30 DIAGNOSIS — Z9889 Other specified postprocedural states: Secondary | ICD-10-CM

## 2017-04-30 DIAGNOSIS — G8929 Other chronic pain: Secondary | ICD-10-CM

## 2017-04-30 DIAGNOSIS — R262 Difficulty in walking, not elsewhere classified: Secondary | ICD-10-CM | POA: Diagnosis not present

## 2017-04-30 NOTE — Patient Instructions (Addendum)
BOSU step ups x 12 bilaterally with min HHA   BOSU SLDL with blue side up x 8 for 2 sets bilaterally with bilateral HHA   TRX Sit to stands x15 for 3 sets   Supine bridging (attempted single leg marching, which she fatigued too quickly with after 2-3 reps) -- 5 repetitions for 15" on, 45" off   SLS on blue foam pad x 30" bout for 2 bouts

## 2017-05-01 NOTE — Therapy (Signed)
[facility] [address], [place], [address] Phone: [phone]   Fax:  [phone]  Physical Therapy Treatment  Patient Details  Name: Katherine Booker MRN: [id number] Date of Birth: Jun 20, 1971 Referring Provider: Dr. Loistine Simas  Encounter Date: 04/30/2017      PT End of Session - 04/30/17 1819    Visit Number 7   Number of Visits 11   Date for PT Re-Evaluation 05/11/17   PT Start Time 1753   PT Stop Time 1832   PT Time Calculation (min) 39 min   Activity Tolerance Patient tolerated treatment well   Behavior During Therapy [facility] for tasks assessed/performed      Past Medical History:  Diagnosis Date  . Plantar fasciitis     Past Surgical History:  Procedure Laterality Date  . none      There were no vitals filed for this visit.      Subjective Assessment - 04/30/17 1754    Subjective Patient reports she walked over the weekend with her co-workers for at least 1.5 miles without noticing any increase in low back pain. She did still get stiffness and pain from long car ride (~5 hours), but this has subsided with no symptoms in her LEs.    Limitations Sitting;Standing;Walking   How long can you sit comfortably? 30-60 minutes   How long can you stand comfortably? 15-20 minutes    How long can you walk comfortably? 20-30 minutes   Patient Stated Goals To be able to walk more without pain.    Currently in Pain? No/denies      BOSU step ups x 12 bilaterally with min HHA for 2 sets   BOSU SLDL with blue side up x 8 for 2 sets bilaterally with bilateral HHA   TRX Sit to stands x15 for 3 sets   Supine bridging (attempted single leg marching, which she fatigued too quickly with after 2-3 reps) -- 5 repetitions for 15" on, 45" off   SLS on blue foam pad x 30" bout for 2 bouts while catching ball and tossing back with therapist to work on gluteal stability training (not ankle strategy)   Standing hip  abductions on MATRIX with 25# x 12 bilaterally                             PT Education - 04/30/17 1819    Education provided Yes   Education Details Will work toward d/c in the next few weeks with HEP given her progress over the weekend.    Person(s) Educated Patient   Methods Explanation;Demonstration   Comprehension Verbalized understanding;Returned demonstration             PT Long Term Goals - 03/23/17 0859      PT LONG TERM GOAL #1   Title Patient will ambulate for at least 30 minutes with no increase in symptoms to demonstrate improved tolerance for work related activities.    Time 4   Period Weeks   Status New   Target Date 04/20/17     PT LONG TERM GOAL #2   Title Patient will be able to sit for at least 60 minutes with no increase in symptoms to complete work related activities.      PT LONG TERM GOAL #3   Title Patient will report worst pain in lumbar spine as no more than 2/10 to demonstrate improved tolerance for ADLs.  Time 4   Period Weeks   Status New   Target Date 04/20/17               Plan - 04/30/17 1820    Clinical Impression Statement Patient continues to tolerate increase in ambulation distance tolerance as well as progression of ther-ex in clinic time. She has still had pain and stiffness from prolonged flexed positions, though this may benefit from graded exposure to flexion. She is progressing nicely with no LE symptoms.    Clinical Presentation Stable   Clinical Decision Making Moderate   Rehab Potential Good   PT Frequency 2x / week   PT Duration 4 weeks   PT Treatment/Interventions Aquatic Therapy;Biofeedback;Cryotherapy;Electrical Stimulation;Moist Heat;Gait training;Stair training;Neuromuscular re-education;Dry needling;Taping;Patient/family education;Therapeutic activities;Therapeutic exercise;Balance training   PT Next Visit Plan Progress through HEP to build TA/multifidi/gluteal strength and activation as  tolerated and appropriate.   PT Home Exercise Plan Supine bridging, sidelying clamshells and hip abductions, TA sets/isometrics, hooklying pelvic tilts;   Consulted and Agree with Plan of Care Patient      Patient will benefit from skilled therapeutic intervention in order to improve the following deficits and impairments:  Abnormal gait, Decreased balance, Difficulty walking, Decreased endurance, Decreased activity tolerance, Pain, Improper body mechanics  Visit Diagnosis: History of back surgery  Difficulty in walking, not elsewhere classified  Chronic midline low back pain with left-sided sciatica     Problem List Patient Active Problem List   Diagnosis Date Noted  . Vitamin D deficiency 08/11/2016  . Plantar fasciitis 08/11/2016  . HNP (herniated nucleus pulposus), lumbar 08/11/2016   Alva Garnet PT, DPT, CSCS    05/01/2017, 9:00 AM  [facility] SPORTS MEDICINE [address], [place], [address] Phone: [phone]   Fax:  [phone]  Name: Katherine Booker MRN: [id number] Date of Birth: [birth year]

## 2017-05-04 ENCOUNTER — Ambulatory Visit: Payer: BLUE CROSS/BLUE SHIELD | Attending: Orthopedic Surgery | Admitting: Physical Therapy

## 2017-05-04 DIAGNOSIS — M5442 Lumbago with sciatica, left side: Secondary | ICD-10-CM | POA: Diagnosis not present

## 2017-05-04 DIAGNOSIS — Z9889 Other specified postprocedural states: Secondary | ICD-10-CM | POA: Diagnosis not present

## 2017-05-04 DIAGNOSIS — G8929 Other chronic pain: Secondary | ICD-10-CM | POA: Insufficient documentation

## 2017-05-04 DIAGNOSIS — R262 Difficulty in walking, not elsewhere classified: Secondary | ICD-10-CM | POA: Diagnosis not present

## 2017-05-04 NOTE — Therapy (Signed)
[facility] [address], [place], [address] Phone: [phone]   Fax:  [phone]  Physical Therapy Treatment  Patient Details  Name: Katherine Booker MRN: [id number] Date of Birth: 01/01/1971 Referring Provider: Dr. Loistine Simas  Encounter Date: 05/04/2017      PT End of Session - 05/04/17 0818    Visit Number 8   Number of Visits 11   Date for PT Re-Evaluation 05/11/17   PT Start Time 0805   PT Stop Time 0845   PT Time Calculation (min) 40 min   Activity Tolerance Patient tolerated treatment well   Behavior During Therapy [facility] for tasks assessed/performed      Past Medical History:  Diagnosis Date  . Plantar fasciitis     Past Surgical History:  Procedure Laterality Date  . none      There were no vitals filed for this visit.      Subjective Assessment - 05/04/17 0807    Subjective Patient reports she worked a lot over the weekend, which causes her back to continue to get tight. She denies having any of the back pain she was experiencing before and is not having any alteration to her gait pattern.    Limitations Sitting;Standing;Walking   How long can you sit comfortably? 30-60 minutes   How long can you stand comfortably? 15-20 minutes    How long can you walk comfortably? 20-30 minutes   Patient Stated Goals To be able to walk more without pain.    Currently in Pain? Other (Comment)  More stiffness than pain in her lumbar spine.      Prone on elbows x 5 minutes  Standing lumbar extensions on foam roller x 5 bouts x 30" per bout, cuing for proper technique while completing.   Seated forward flexion with thera-ball x 15 repetitions x 2 sets as she found this to be a beneficial stretch sensation in lumbar spine   **She reported that while the flexion stretch felt good in the moment, she found the extension based work to be more beneficial.   SLDL on blue side up of BOSU with rotations on  every other repetition x 8 per side before she began to fatigue   Standing hip abduction x 20 with bilateral UE on blue side up of BOSU  Step ups to blue side up of BOSU with hip flexion march x 15 per side                             PT Education - 05/04/17 0813    Education provided Yes   Education Details Stiffness is a sign exacerbation, would benefit from standing trunk extensions.   Person(s) Educated Patient   Methods Explanation;Demonstration;Handout   Comprehension Verbalized understanding;Returned demonstration             PT Long Term Goals - 03/23/17 0859      PT LONG TERM GOAL #1   Title Patient will ambulate for at least 30 minutes with no increase in symptoms to demonstrate improved tolerance for work related activities.    Time 4   Period Weeks   Status New   Target Date 04/20/17     PT LONG TERM GOAL #2   Title Patient will be able to sit for at least 60 minutes with no increase in symptoms to complete work related activities.      PT LONG TERM GOAL #  3   Title Patient will report worst pain in lumbar spine as no more than 2/10 to demonstrate improved tolerance for ADLs.    Time 4   Period Weeks   Status New   Target Date 04/20/17               Plan - 05/04/17 0818    Clinical Impression Statement Patient is still fatiguing/getting tight, though this is expected as she had such a prolonged period of immobility secondary to pain pre-operatively. She is tolerating therapy exercises well, though there are still notable differences in single leg exercises L weaker than R. She is certainly more mobile and tolerating more activity before stiffness over the last few weeks.    Clinical Presentation Stable   Clinical Decision Making Moderate   Rehab Potential Good   PT Frequency 2x / week   PT Duration 4 weeks   PT Treatment/Interventions Aquatic Therapy;Biofeedback;Cryotherapy;Electrical Stimulation;Moist Heat;Gait training;Stair  training;Neuromuscular re-education;Dry needling;Taping;Patient/family education;Therapeutic activities;Therapeutic exercise;Balance training   PT Next Visit Plan Progress through HEP to build TA/multifidi/gluteal strength and activation as tolerated and appropriate.   PT Home Exercise Plan Supine bridging, sidelying clamshells and hip abductions, TA sets/isometrics, hooklying pelvic tilts;   Consulted and Agree with Plan of Care Patient      Patient will benefit from skilled therapeutic intervention in order to improve the following deficits and impairments:  Abnormal gait, Decreased balance, Difficulty walking, Decreased endurance, Decreased activity tolerance, Pain, Improper body mechanics  Visit Diagnosis: History of back surgery  Difficulty in walking, not elsewhere classified  Chronic midline low back pain with left-sided sciatica     Problem List Patient Active Problem List   Diagnosis Date Noted  . Vitamin D deficiency 08/11/2016  . Plantar fasciitis 08/11/2016  . HNP (herniated nucleus pulposus), lumbar 08/11/2016   Alva Garnet PT, DPT, CSCS    05/04/2017, 9:03 AM  [facility] SPORTS MEDICINE [address], [place], [address] Phone: [phone]   Fax:  [phone]  Name: Esmee Fallaw MRN: [id number] Date of Birth: 1970/10/31

## 2017-05-04 NOTE — Patient Instructions (Addendum)
Prone on elbows x 5 minutes  Standing lumbar extensions on foam roller  Seated forward flexion with thera-ball x 15 repetitions x 2 sets as she found this to be a beneficial stretch sensation in lumbar spine   **She reported   SLDL on blue side up of BOSU with rotations on every other repetition x 8 per side before she began to fatigue   Standing hip abduction x 20 with bilateral UE on blue side up of BOSU  Step ups to blue side up of BOSU with hip flexion march x 15 per side

## 2017-05-06 ENCOUNTER — Ambulatory Visit: Payer: BLUE CROSS/BLUE SHIELD | Admitting: Physical Therapy

## 2017-05-15 ENCOUNTER — Ambulatory Visit: Payer: BLUE CROSS/BLUE SHIELD | Admitting: Physical Therapy

## 2017-05-15 DIAGNOSIS — R262 Difficulty in walking, not elsewhere classified: Secondary | ICD-10-CM | POA: Diagnosis not present

## 2017-05-15 DIAGNOSIS — G8929 Other chronic pain: Secondary | ICD-10-CM | POA: Diagnosis not present

## 2017-05-15 DIAGNOSIS — Z9889 Other specified postprocedural states: Secondary | ICD-10-CM

## 2017-05-15 DIAGNOSIS — M5442 Lumbago with sciatica, left side: Secondary | ICD-10-CM | POA: Diagnosis not present

## 2017-05-15 NOTE — Therapy (Signed)
[place] [facility] [address], [place], [address] Phone: [phone]   Fax:  [phone]  Physical Therapy Treatment  Patient Details  Name: Dianah Pruett MRN: [id number] Date of Birth: 1970/08/20 Referring Provider: Dr. Loistine Simas  Encounter Date: 05/15/2017      PT End of Session - 05/15/17 1057    Visit Number 9   Number of Visits 11   Date for PT Re-Evaluation 06/12/17   PT Start Time 0800   PT Stop Time 0830   PT Time Calculation (min) 30 min   Activity Tolerance Patient tolerated treatment well   Behavior During Therapy [facility] for tasks assessed/performed      Past Medical History:  Diagnosis Date  . Plantar fasciitis     Past Surgical History:  Procedure Laterality Date  . none      There were no vitals filed for this visit.      Subjective Assessment - 05/15/17 0802    Subjective Patient reports her back still gets stiff during games/work, but has largely been ok.    Limitations Sitting;Standing;Walking   How long can you sit comfortably? 30-60 minutes   How long can you stand comfortably? 15-20 minutes    How long can you walk comfortably? 20-30 minutes   Patient Stated Goals To be able to walk more without pain.    Currently in Pain? No/denies      Educated patient on standing lumbar extension/prone extensions/foam rolling in standing for lumbar extension when her back gets tight   Leg Press - 45# x 15, 75# x 12  Modified planks for 5" holds x 5 repetitions (appropriate technique)   Supine bridging for 10" holds x 8 repetitions (no pain reported)                            PT Education - 05/15/17 1056    Education provided Yes   Education Details Updated HEP to be 2-3x per week for abdominal strength, leg strength to reduce stiffness exacerbations.    Person(s) Educated Patient   Methods Explanation;Demonstration;Handout   Comprehension Verbalized  understanding;Returned demonstration             PT Long Term Goals - 05/15/17 0821      PT LONG TERM GOAL #1   Title Patient will ambulate for at least 30 minutes with no increase in symptoms to demonstrate improved tolerance for work related activities.    Baseline She is not sure she could at this time, hasn't tried. - 10/12   Time 4   Period Weeks   Status Partially Met     PT LONG TERM GOAL #2   Title Patient will be able to sit for at least 60 minutes with no increase in symptoms to complete work related activities.    Time 4   Period Weeks   Status Achieved   Target Date 05/15/17     PT LONG TERM GOAL #3   Title Patient will report worst pain in lumbar spine as no more than 2/10 to demonstrate improved tolerance for ADLs.    Baseline No pain reported, just tightness still.    Time 4   Period Weeks   Status Achieved               Plan - 05/15/17 1057    Clinical Impression Statement Patient continues to be pain free, she still has episodes of tightness,  but was educated on best options to work on relieving this (prone extensions, standing extensions, foam roller). Educated on the need to continue to progress core and LE strengthening to offload lumbar paraspinals.    Clinical Presentation Stable   Clinical Decision Making Moderate   Rehab Potential Good   PT Frequency 2x / week   PT Duration 4 weeks   PT Treatment/Interventions Aquatic Therapy;Biofeedback;Cryotherapy;Electrical Stimulation;Moist Heat;Gait training;Stair training;Neuromuscular re-education;Dry needling;Taping;Patient/family education;Therapeutic activities;Therapeutic exercise;Balance training   PT Next Visit Plan Progress through HEP to build TA/multifidi/gluteal strength and activation as tolerated and appropriate.   PT Home Exercise Plan Supine bridging, sidelying clamshells and hip abductions, TA sets/isometrics, hooklying pelvic tilts;   Consulted and Agree with Plan of Care Patient       Patient will benefit from skilled therapeutic intervention in order to improve the following deficits and impairments:  Abnormal gait, Decreased balance, Difficulty walking, Decreased endurance, Decreased activity tolerance, Pain, Improper body mechanics  Visit Diagnosis: History of back surgery  Difficulty in walking, not elsewhere classified  Chronic midline low back pain with left-sided sciatica     Problem List Patient Active Problem List   Diagnosis Date Noted  . Vitamin D deficiency 08/11/2016  . Plantar fasciitis 08/11/2016  . HNP (herniated nucleus pulposus), lumbar 08/11/2016   Royce Macadamia PT, DPT, CSCS    05/15/2017, 11:07 AM  [facility] SPORTS MEDIC[facility] [address], [place], [address] Phone: [phone]   Fax:  [phone]  Name: Quanda Pavlicek MRN: [id number] Date of Birth: May 21, 1971

## 2017-05-15 NOTE — Patient Instructions (Addendum)
Educated patient on standing lumbar extension/prone extensions/foam rolling in standing for lumbar extension when her back gets tight   Leg Press - 45# x 15, 75# x 12

## 2017-09-21 IMAGING — MR MR LUMBAR SPINE W/O CM
4 of 5 series · 21 of 48 positions shown · non-contrast
Comparison: Lumbar spine radiographs 07/18/2016.

CLINICAL DATA: Left greater right lower extremity pain. Pain for 1
month. Pain worse with standing.

EXAM:
MRI LUMBAR SPINE WITHOUT CONTRAST
TECHNIQUE: Multiplanar, multisequence MR imaging of the lumbar spine was
performed. No intravenous contrast was administered.

[Series 2: T2 · sagittal · 4.5mm · 0.59mm/px · 6 of 17 slices shown (1 of 2)]
[im 1/17]
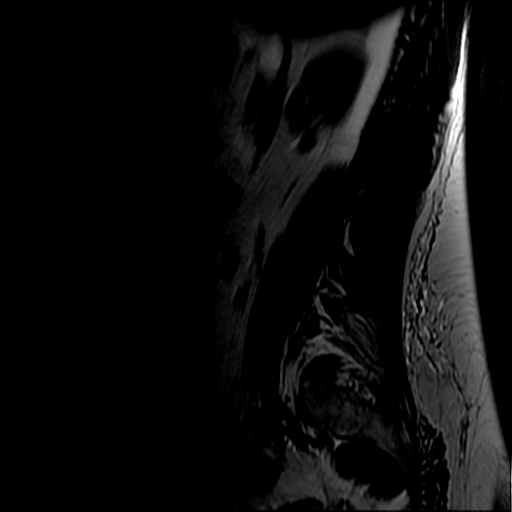
[im 4/17]
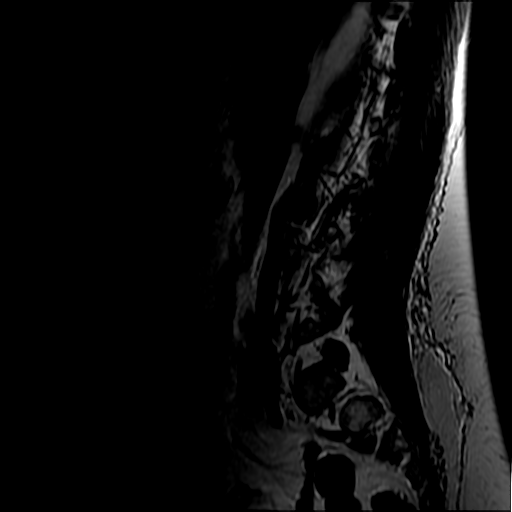
[im 7/17]
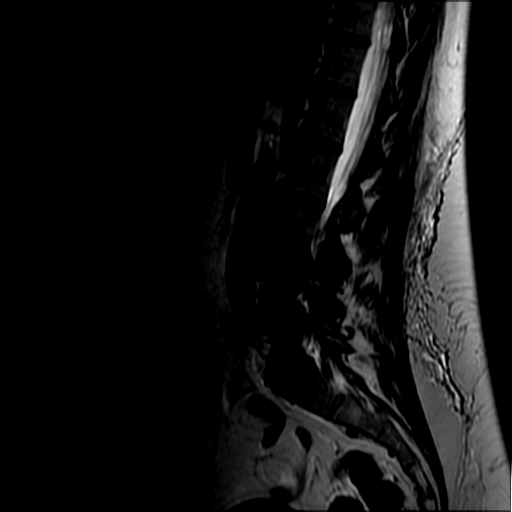
[im 10/17]
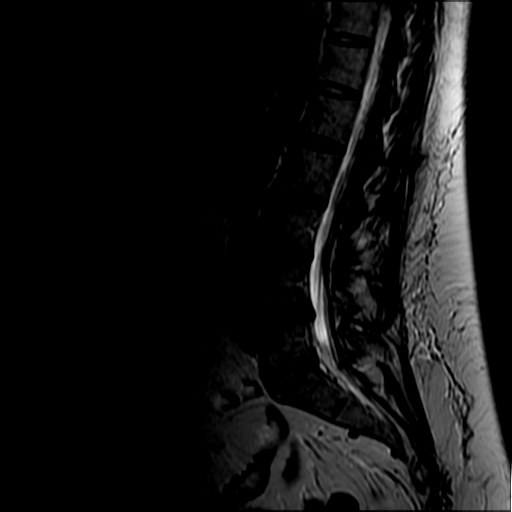
[im 13/17]
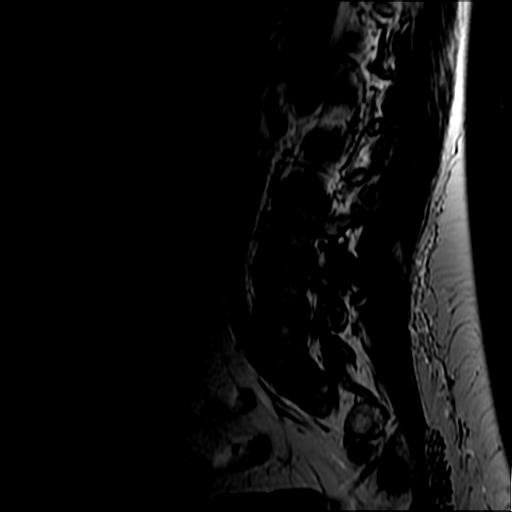
[im 17/17]
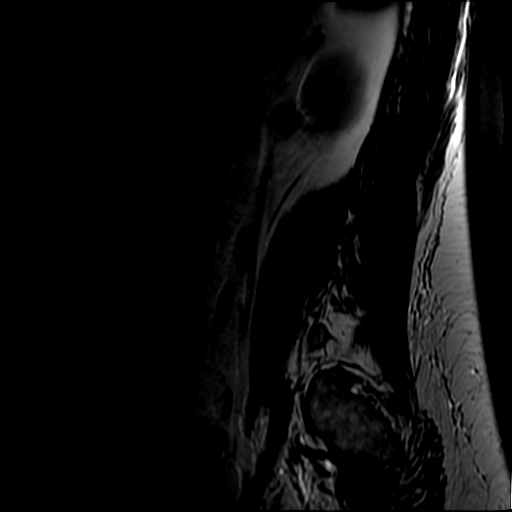

[Series 4: T1 · sagittal · 4.5mm · 0.59mm/px · 3 of 17 slices shown (1 of 2)]
[im 4/17]
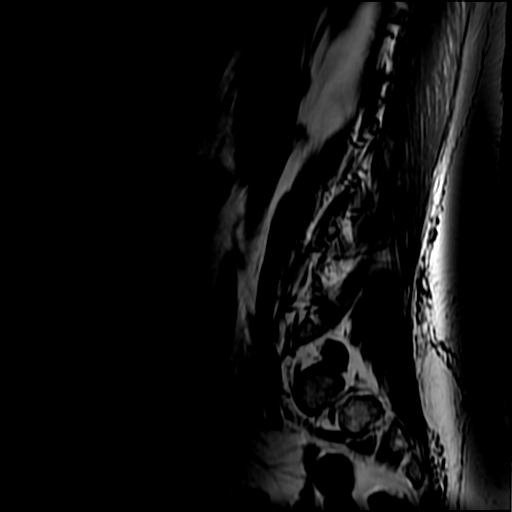
[im 10/17]
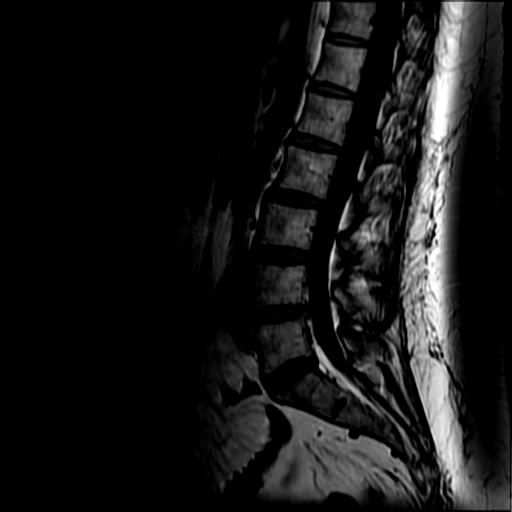
[im 17/17]
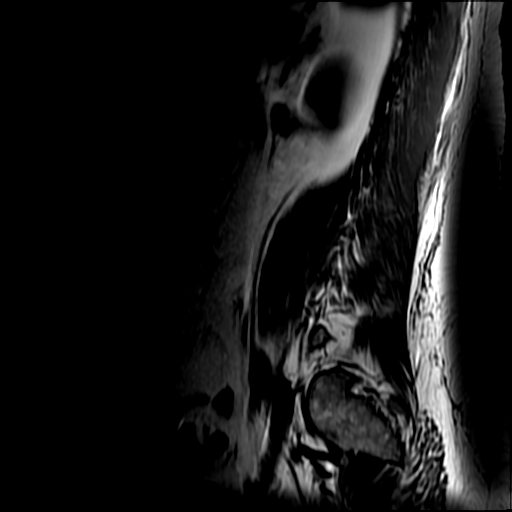

[Series 5: T2 · axial · 4.0mm · 0.43mm/px · z∈[-147,+62]mm · 9 of 40 slices shown (2 of 2)]
[im 1/40]
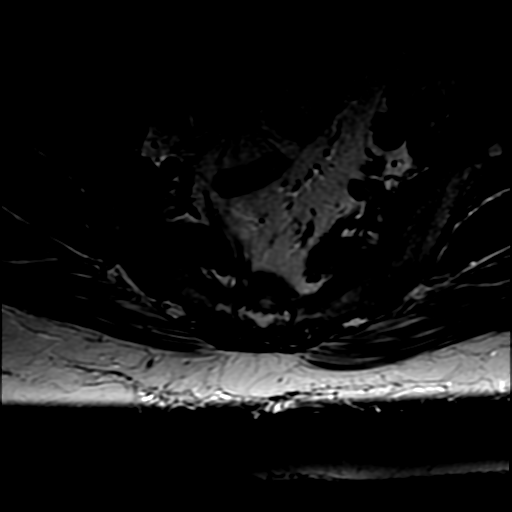
[im 6/40]
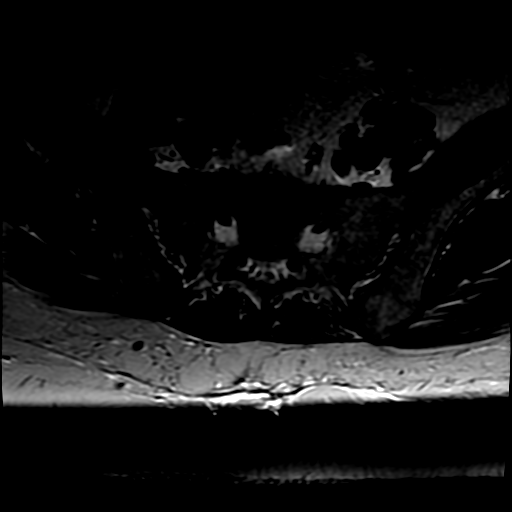
[im 12/40]
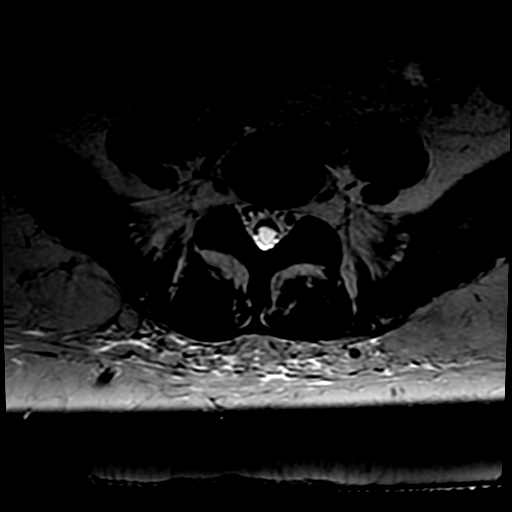
[im 17/40]
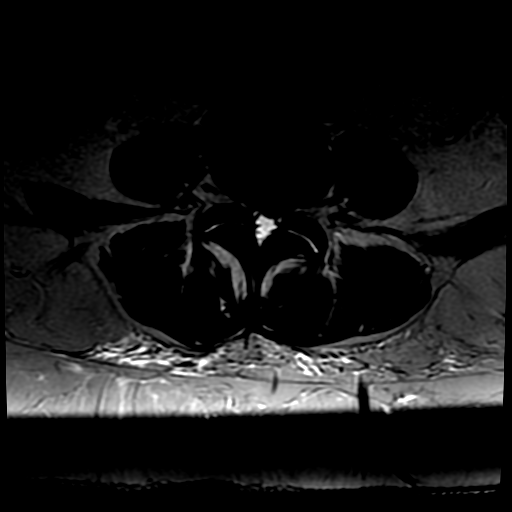
[im 20/40]
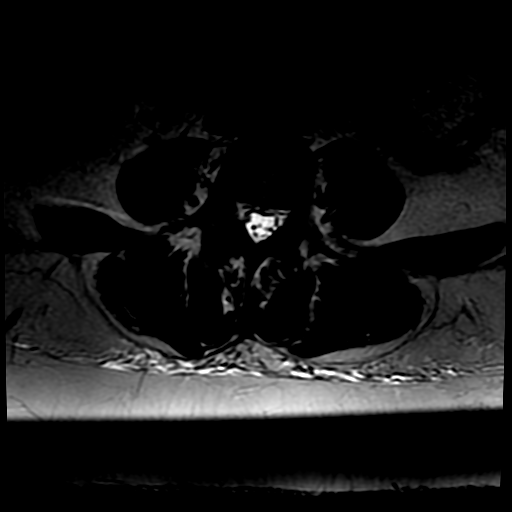
[im 23/40]
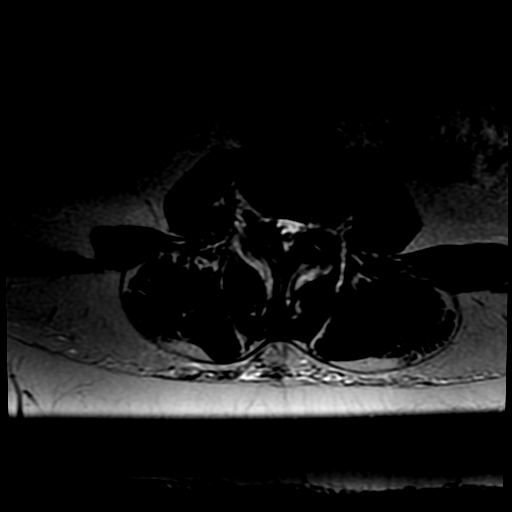
[im 28/40]
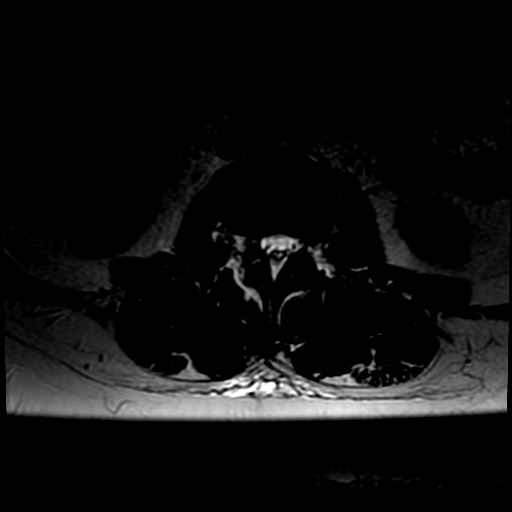
[im 34/40]
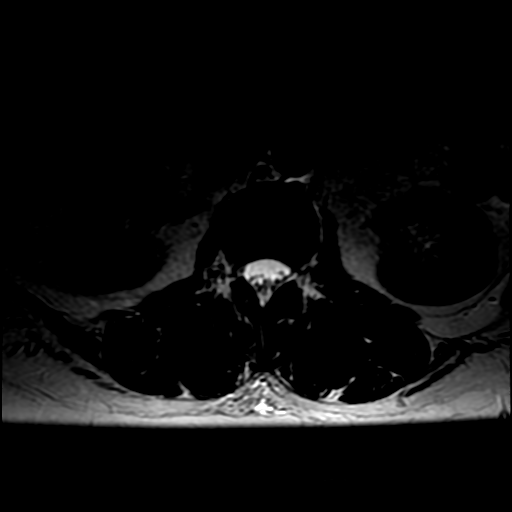
[im 40/40]
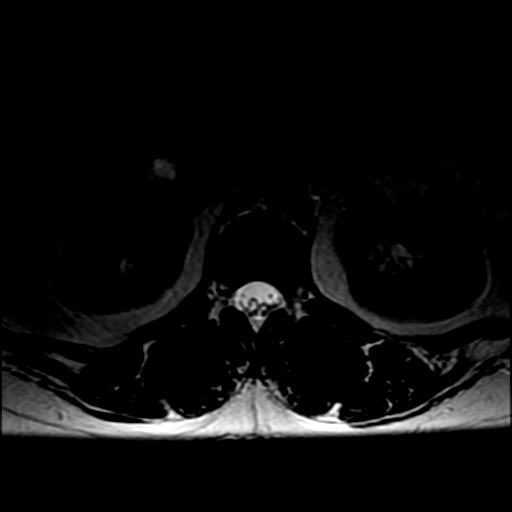

[Series 6: T1 · axial · 4.0mm · 0.86mm/px · z∈[-122,+32]mm · 3 of 40 slices shown (2 of 2)]
[im 6/40]
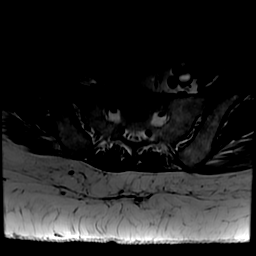
[im 20/40]
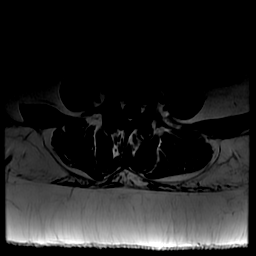
[im 34/40]
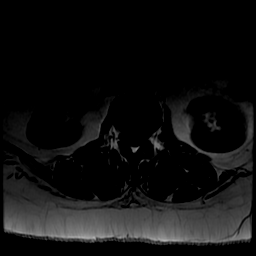

[21 of 48 positions shown; findings below may reference images not displayed]

FINDINGS: Segmentation: 5 non rib-bearing lumbar type vertebral bodies are
present.

Alignment: AP alignment is anatomic. Leftward curvature is centered
at L4.

Vertebrae: A hemangioma is present at L3. Marrow signal and
vertebral body heights are normal.

Conus medullaris: Extends to the T12-L1. Level and appears normal.

Paraspinal and other soft tissues: Limited imaging of the abdomen is
unremarkable. There is no significant adenopathy.

Disc levels:

L1-2:  Negative.

L2-3: Mild disc bulging is present. There is no significant
stenosis.

L3-4: A broad-based disc protrusion is present. Moderate facet
hypertrophy is noted bilaterally. This results in mild subarticular
and foraminal narrowing bilaterally, worse on the left.

L4-5: A left paramedian disc protrusion is present. Moderate left
and mild right subarticular narrowing is present. Facet hypertrophy
is worse on the right. Mild right foraminal narrowing is noted.

L5-S1:  Negative.
IMPRESSION: 1. The most significant left-sided disease is at L4-5 where a left
paramedian disc protrusion results in moderate left and mild right
subarticular stenosis.
2. Mild subarticular and foraminal narrowing bilaterally at L3-4 is
worse on the left.
3. Mild disc bulging at L2-3 without significant stenosis.

## 2019-01-19 DIAGNOSIS — D485 Neoplasm of uncertain behavior of skin: Secondary | ICD-10-CM | POA: Diagnosis not present

## 2019-01-19 DIAGNOSIS — Z1283 Encounter for screening for malignant neoplasm of skin: Secondary | ICD-10-CM | POA: Diagnosis not present

## 2019-01-19 DIAGNOSIS — L304 Erythema intertrigo: Secondary | ICD-10-CM | POA: Diagnosis not present

## 2019-01-19 DIAGNOSIS — D225 Melanocytic nevi of trunk: Secondary | ICD-10-CM | POA: Diagnosis not present

## 2019-01-19 DIAGNOSIS — B078 Other viral warts: Secondary | ICD-10-CM | POA: Diagnosis not present

## 2019-02-04 ENCOUNTER — Other Ambulatory Visit: Payer: Self-pay

## 2019-02-04 DIAGNOSIS — Z20822 Contact with and (suspected) exposure to covid-19: Secondary | ICD-10-CM

## 2019-02-07 ENCOUNTER — Other Ambulatory Visit: Payer: BLUE CROSS/BLUE SHIELD

## 2019-02-07 DIAGNOSIS — R6889 Other general symptoms and signs: Secondary | ICD-10-CM | POA: Diagnosis not present

## 2019-02-07 DIAGNOSIS — Z20822 Contact with and (suspected) exposure to covid-19: Secondary | ICD-10-CM

## 2019-02-12 LAB — NOVEL CORONAVIRUS, NAA: SARS-CoV-2, NAA: NOT DETECTED

## 2019-05-24 ENCOUNTER — Other Ambulatory Visit: Payer: Self-pay

## 2019-05-24 ENCOUNTER — Ambulatory Visit: Payer: Self-pay

## 2019-05-24 DIAGNOSIS — Z23 Encounter for immunization: Secondary | ICD-10-CM

## 2019-07-18 ENCOUNTER — Telehealth: Payer: Self-pay | Admitting: Medical

## 2019-07-18 ENCOUNTER — Other Ambulatory Visit: Payer: Self-pay

## 2019-07-18 DIAGNOSIS — N3000 Acute cystitis without hematuria: Secondary | ICD-10-CM

## 2019-07-18 DIAGNOSIS — R300 Dysuria: Secondary | ICD-10-CM

## 2019-07-18 MED ORDER — CIPROFLOXACIN HCL 500 MG PO TABS: 500.0000 mg | ORAL_TABLET | Freq: Two times a day (BID) | ORAL | 0 refills | Status: AC

## 2019-07-18 NOTE — Progress Notes (Signed)
Discussed risk and benefits of telehealth services and the patient consent obtained for such telehealth services.  48 yo female in non acute distress ( at work).  Just got over Covid-19 virus. 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   Painful urination , after urinating still feels she has to urinate and then it is difficult  To urinate , volume is decreased, low back pain  all the way across.  She has frequency and urgency , no hematuria, no fever or chills.  Dx positive test Covid  was last Monday, symptoms a week before. Fever Saturday and Sunday.  Health Department advised patient to return to work on Dec 14.  Feels weak, persistent cough non productive taking Mucinex DM to help with cough and mucus with relief  .History UTI  6-7 years ago with similar symptoms.  Denies pyelonephritis history.  Review of Systems  Constitutional: Positive for malaise/fatigue and weight loss (11  pounds). Negative for chills and fever.  HENT: Negative.   Eyes: Negative.   Respiratory: Positive for cough (dry). Negative for hemoptysis, sputum production, shortness of breath and wheezing.   Cardiovascular: Negative.   Gastrointestinal: Positive for nausea (Saturday now resolved).  Genitourinary: Positive for dysuria, frequency and urgency. Negative for flank pain and hematuria.  Musculoskeletal: Negative.   Neurological: Positive for weakness (just tired).  Endo/Heme/Allergies: Positive for polydipsia.  Psychiatric/Behavioral: Negative.    PE Other PE not performed due to telemedicine visit. Pressure with self palpatation suprapubic. Color of urine a light yellow.  A/Dx Dyuria, most likely Urinary tract infection Meds ordered this encounter  Medications  . ciprofloxacin (CIPRO) 500 MG tablet    Sig: Take 1 tablet (500 mg total) by mouth 2 (two) times daily.    Dispense:  14 tablet    Refill:  0  Increase water intake , a glass or two of cranberry juice. Reviewed with patient she may take Azo for  2 days  ( will turn urine orange), then hopefully antibiotics will be working, if worsening pain , nausea, vomiting, fever or any other concerns to seek out medical care for change in antibioticaan examination, with urinalysis and a culture and sensitivity of urine sent to lab. Patient denies pregnancy. Patient verbalizes understanding and has no questions at the end of our conversation.Marland Kitchen

## 2020-02-12 ENCOUNTER — Other Ambulatory Visit: Payer: Self-pay | Admitting: Dermatology

## 2023-04-21 ENCOUNTER — Other Ambulatory Visit: Payer: Self-pay

## 2023-04-21 DIAGNOSIS — R7303 Prediabetes: Secondary | ICD-10-CM

## 2023-04-21 DIAGNOSIS — R030 Elevated blood-pressure reading, without diagnosis of hypertension: Secondary | ICD-10-CM

## 2024-02-10 ENCOUNTER — Other Ambulatory Visit: Payer: Self-pay | Admitting: Obstetrics and Gynecology

## 2024-02-10 DIAGNOSIS — Z1231 Encounter for screening mammogram for malignant neoplasm of breast: Secondary | ICD-10-CM

## 2024-06-03 ENCOUNTER — Ambulatory Visit: Admitting: Anesthesiology

## 2024-06-03 ENCOUNTER — Encounter: Payer: Self-pay | Admitting: Gastroenterology

## 2024-06-03 ENCOUNTER — Ambulatory Visit
Admission: RE | Admit: 2024-06-03 | Discharge: 2024-06-03 | Disposition: A | Discharge status: Home / Self Care | Attending: Gastroenterology | Admitting: Gastroenterology

## 2024-06-03 ENCOUNTER — Encounter: Admission: RE | Disposition: A | Payer: Self-pay | Source: Home / Self Care | Attending: Gastroenterology

## 2024-06-03 DIAGNOSIS — Z1211 Encounter for screening for malignant neoplasm of colon: Secondary | ICD-10-CM | POA: Insufficient documentation

## 2024-06-03 DIAGNOSIS — D124 Benign neoplasm of descending colon: Secondary | ICD-10-CM | POA: Insufficient documentation

## 2024-06-03 HISTORY — PX: COLONOSCOPY: SHX5424

## 2024-06-03 LAB — POCT PREGNANCY, URINE: Preg Test, Ur: NEGATIVE

## 2024-06-03 SURGERY — COLONOSCOPY
Anesthesia: General

## 2024-06-03 MED ORDER — PROPOFOL 10 MG/ML IV BOLUS
INTRAVENOUS | Status: DC | PRN
  Administered 2024-06-03: 100 mg via INTRAVENOUS
  Administered 2024-06-03: 50 mg via INTRAVENOUS

## 2024-06-03 MED ORDER — PROPOFOL 500 MG/50ML IV EMUL
INTRAVENOUS | Status: DC | PRN
  Administered 2024-06-03: 150 ug/kg/min via INTRAVENOUS

## 2024-06-03 MED ORDER — PROPOFOL 1000 MG/100ML IV EMUL
INTRAVENOUS | Status: AC
  Filled 2024-06-03: qty 100

## 2024-06-03 MED ORDER — SODIUM CHLORIDE 0.9 % IV SOLN
INTRAVENOUS | Status: DC
  Administered 2024-06-03: 20 mL/h via INTRAVENOUS

## 2024-06-03 NOTE — Anesthesia Postprocedure Evaluation (Signed)
 Anesthesia Post Note  Patient: Katherine Booker  Procedure(s) Performed: COLONOSCOPY  Patient location during evaluation: PACU Anesthesia Type: General Level of consciousness: awake Pain management: satisfactory to patient Vital Signs Assessment: post-procedure vital signs reviewed and stable Cardiovascular status: blood pressure returned to baseline Anesthetic complications: no   There were no known notable events for this encounter.   Last Vitals:  Vitals:   06/03/24 0817 06/03/24 0826  BP: 133/81 132/82  Pulse: 79 94  Resp: 20 16  Temp: (!) 35.8 C   SpO2: 100% 100%    Last Pain:  Vitals:   06/03/24 0817  TempSrc: Temporal  PainSc: 0-No pain                 VAN STAVEREN,Daneesha Quinteros

## 2024-06-03 NOTE — Anesthesia Preprocedure Evaluation (Signed)
 Anesthesia Evaluation  Patient identified by MRN, date of birth, ID band Patient awake    Reviewed: Allergy & Precautions, NPO status , Patient's Chart, lab work & pertinent test results  Airway Mallampati: III  TM Distance: <3 FB Neck ROM: full    Dental  (+) Teeth Intact   Pulmonary neg pulmonary ROS   Pulmonary exam normal  + decreased breath sounds      Cardiovascular Exercise Tolerance: Good negative cardio ROS Normal cardiovascular exam Rhythm:Regular Rate:Normal     Neuro/Psych negative neurological ROS  negative psych ROS   GI/Hepatic negative GI ROS, Neg liver ROS,,,  Endo/Other  negative endocrine ROS  Class 4 obesity  Renal/GU negative Renal ROS  negative genitourinary   Musculoskeletal   Abdominal  (+) + obese  Peds negative pediatric ROS (+)  Hematology negative hematology ROS (+)   Anesthesia Other Findings Past Medical History: No date: Plantar fasciitis  Past Surgical History: No date: none  BMI    Body Mass Index: 55.61 kg/m      Reproductive/Obstetrics negative OB ROS                              Anesthesia Physical Anesthesia Plan  ASA: 2  Anesthesia Plan: General   Post-op Pain Management:    Induction: Intravenous  PONV Risk Score and Plan: Propofol  infusion and TIVA  Airway Management Planned: Natural Airway and Nasal Cannula  Additional Equipment:   Intra-op Plan:   Post-operative Plan:   Informed Consent: I have reviewed the patients History and Physical, chart, labs and discussed the procedure including the risks, benefits and alternatives for the proposed anesthesia with the patient or authorized representative who has indicated his/her understanding and acceptance.     Dental Advisory Given  Plan Discussed with: CRNA  Anesthesia Plan Comments:          Anesthesia Quick Evaluation

## 2024-06-03 NOTE — H&P (Signed)
 Katherine JONELLE Brooklyn, MD [facility], DHIP [address]  [place], [place] [address]  Main: [phone] Fax:  [phone] Pager: [phone]   Primary Care Physician:  Justus Leita DEL, MD Primary Gastroenterologist:  Dr. Corinn JONELLE Booker  Pre-Procedure History & Physical: HPI:  Rosalynn Sergent is a 53 y.o. female is here for an colonoscopy.   Past Medical History:  Diagnosis Date   Plantar fasciitis     Past Surgical History:  Procedure Laterality Date   none      Prior to Admission medications   Medication Sig Start Date End Date Taking? Authorizing Provider  semaglutide-weight management (WEGOVY) 0.5 MG/0.5ML SOAJ SQ injection Inject 0.5 mg into the skin once a week.   Yes [provider]  ciprofloxacin  (CIPRO ) 500 MG tablet Take 1 tablet (500 mg total) by mouth 2 (two) times daily. 07/18/19   Ratcliffe, Heather R, PA-C  predniSONE (STERAPRED UNI-PAK 48 TAB) 10 MG (48) TBPK tablet USE AS DIRECTED PER PACKAGING. 12 DAY TAPER 08/02/16   [provider]    Allergies as of 04/28/2024   (No Known Allergies)    Family History  Problem Relation Age of Onset   Diabetes Father    Dementia Mother     Social History   Socioeconomic History   Marital status: Single    Spouse name: Not on file   Number of children: 0   Years of education: Not on file   Highest education level: Not on file  Occupational History   Occupation: academic advisor    Comment: [employer]  Tobacco Use   Smoking status: Never   Smokeless tobacco: Never  Substance and Sexual Activity   Alcohol use: No   Drug use: No   Sexual activity: Not Currently  Other Topics Concern   Not on file  Social History Narrative   Not on file   Social Drivers of Health   Financial Resource Strain: Low Risk  (02/09/2024)   Received from [facility]   Overall Financial Resource Strain (CARDIA)    Difficulty of Paying Living Expenses: Not hard  at all  Food Insecurity: No Food Insecurity (02/09/2024)   Received from [facility]   Hunger Vital Sign    Within the past 12 months, you worried that your food would run out before you got the money to buy more.: Never true    Within the past 12 months, the food you bought just didn't last and you didn't have money to get more.: Never true  Transportation Needs: No Transportation Needs (02/09/2024)   Received from [facility] - Transportation    In the past 12 months, has lack of transportation kept you from medical appointments or from getting medications?: No    Lack of Transportation (Non-Medical): No  Physical Activity: Not on file  Stress: Not on file  Social Connections: Not on file  Intimate Partner Violence: Not on file    Review of Systems: See HPI, otherwise negative ROS  Physical Exam: BP (!) 155/87   Pulse 88   Temp 98.2 F (36.8 C) (Temporal)   Resp 20   Ht 5' 4 (1.626 m)   Wt (!) 147 kg   SpO2 99%   BMI 55.61 kg/m  General:   Alert,  pleasant and cooperative in NAD Head:  Normocephalic and atraumatic. Neck:  Supple; no masses or thyromegaly. Lungs:  Clear throughout to auscultation.  Heart:  Regular rate and rhythm. Abdomen:  Soft, nontender and nondistended. Normal bowel sounds, without guarding, and without rebound.   Neurologic:  Alert and  oriented x4;  grossly normal neurologically.  Impression/Plan: Roopa Anastasi is here for an colonoscopy to be performed for colon cancer screening  Risks, benefits, limitations, and alternatives regarding  colonoscopy have been reviewed with the patient.  Questions have been answered.  All parties agreeable.   Katherine Brooklyn, MD  06/03/2024, 7:46 AM

## 2024-06-03 NOTE — Transfer of Care (Signed)
 Immediate Anesthesia Transfer of Care Note  Patient: Katherine Booker  Procedure(s) Performed: COLONOSCOPY  Patient Location: PACU  Anesthesia Type:General  Level of Consciousness: awake and sedated  Airway & Oxygen Therapy: Patient Spontanous Breathing and Patient connected to face mask oxygen  Post-op Assessment: Report given to RN and Post -op Vital signs reviewed and stable  Post vital signs: Reviewed and stable  Last Vitals:  Vitals Value Taken Time  BP    Temp    Pulse    Resp    SpO2      Last Pain:  Vitals:   06/03/24 0652  TempSrc: Temporal  PainSc: 0-No pain         Complications: There were no known notable events for this encounter.

## 2024-06-03 NOTE — Op Note (Signed)
 [facility] Patient Name: Katherine Booker Procedure Date: 06/03/2024 7:12 AM MRN: [id number] Account #: [id number] Date of Birth: Dec 15, 1970 Admit Type: Outpatient Age: 53 Room: [facility] ENDO ROOM 4 Gender: Female Note Status: Finalized Instrument Name: Colon Scope [id number] Procedure:             Colonoscopy Indications:           Screening for colorectal malignant neoplasm, This is                         the patient's first colonoscopy Providers:             Corinn Jess Brooklyn MD, MD Referring MD:          Leita Adie, MD (Referring MD) Medicines:             General Anesthesia Complications:         No immediate complications. Estimated blood loss: None. Procedure:             Pre-Anesthesia Assessment:                        - Prior to the procedure, a History and Physical was                         performed, and patient medications and allergies were                         reviewed. The patient is competent. The risks and                         benefits of the procedure and the sedation options and                         risks were discussed with the patient. All questions                         were answered and informed consent was obtained.                         Patient identification and proposed procedure were                         verified by the physician, the nurse, the                         anesthesiologist, the anesthetist and the technician                         in the pre-procedure area in the procedure room in the                         endoscopy suite. Mental Status Examination: alert and                         oriented. Airway Examination: normal oropharyngeal                         airway and neck mobility. Respiratory Examination:  clear to auscultation. CV Examination: normal.                         Prophylactic Antibiotics: The patient does not require                          prophylactic antibiotics. Prior Anticoagulants: The                         patient has taken no anticoagulant or antiplatelet                         agents. ASA Grade Assessment: II - A patient with mild                         systemic disease. After reviewing the risks and                         benefits, the patient was deemed in satisfactory                         condition to undergo the procedure. The anesthesia                         plan was to use general anesthesia. Immediately prior                         to administration of medications, the patient was                         re-assessed for adequacy to receive sedatives. The                         heart rate, respiratory rate, oxygen saturations,                         blood pressure, adequacy of pulmonary ventilation, and                         response to care were monitored throughout the                         procedure. The physical status of the patient was                         re-assessed after the procedure.                        After obtaining informed consent, the colonoscope was                         passed under direct vision. Throughout the procedure,                         the patient's blood pressure, pulse, and oxygen                         saturations were monitored continuously. The  Colonoscope was introduced through the anus and                         advanced to the the cecum, identified by appendiceal                         orifice and ileocecal valve. The colonoscopy was                         performed without difficulty. The patient tolerated                         the procedure well. The quality of the bowel                         preparation was evaluated using the BBPS [facility] Bowel                         Preparation Scale) with scores of: Right Colon = 3,                         Transverse Colon = 3 and Left Colon = 3 (entire mucosa                          seen well with no residual staining, small fragments                         of stool or opaque liquid). The total BBPS score                         equals 9. The ileocecal valve, appendiceal orifice,                         and rectum were photographed. Findings:      The perianal and digital rectal examinations were normal. Pertinent       negatives include normal sphincter tone and no palpable rectal lesions.      Two sessile polyps were found in the descending colon. The polyps were 4       to 5 mm in size. These polyps were removed with a cold snare. Resection       and retrieval were complete. Estimated blood loss: none.      The retroflexed view of the distal rectum and anal verge was normal and       showed no anal or rectal abnormalities. Impression:            - Two 4 to 5 mm polyps in the descending colon,                         removed with a cold snare. Resected and retrieved.                        - The distal rectum and anal verge are normal on                         retroflexion view. Recommendation:        - Discharge patient to home (with escort).                        -  Resume previous diet today.                        - Continue present medications.                        - Await pathology results.                        - Repeat colonoscopy in 5-10 years for surveillance                         based on pathology results. Procedure Code(s):     --- Professional ---                        [phone], Colonoscopy, flexible; with removal of                         tumor(s), polyp(s), or other lesion(s) by snare                         technique Diagnosis Code(s):     --- Professional ---                        Z12.11, Encounter for screening for malignant neoplasm                         of colon                        D12.4, Benign neoplasm of descending colon CPT copyright 2022 [employer]. All rights reserved. The codes documented in this  report are preliminary and upon coder review may  be revised to meet current compliance requirements. Dr. Corinn Brooklyn Corinn Jess Brooklyn MD, MD 06/03/2024 8:13:25 AM This report has been signed electronically. Number of Addenda: 0 Note Initiated On: 06/03/2024 7:12 AM Scope Withdrawal Time: 0 hours 7 minutes 9 seconds  Total Procedure Duration: 0 hours 10 minutes 56 seconds  Estimated Blood Loss:  Estimated blood loss: none.      [facility]

## 2024-06-06 LAB — SURGICAL PATHOLOGY

## 2024-06-10 ENCOUNTER — Ambulatory Visit: Payer: Self-pay | Admitting: Gastroenterology
